# Patient Record
Sex: Male | Born: 1975 | Race: Black or African American | Hispanic: No | Marital: Single | Smoking: Former smoker
Health system: Southern US, Community
[De-identification: ages and names within clinical notes are randomized; demographics above are authoritative.]

## PROBLEM LIST (undated history)

## (undated) DIAGNOSIS — F419 Anxiety disorder, unspecified: Secondary | ICD-10-CM

## (undated) DIAGNOSIS — I10 Essential (primary) hypertension: Secondary | ICD-10-CM

## (undated) HISTORY — DX: Essential (primary) hypertension: I10

---
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
## 1998-01-10 ENCOUNTER — Emergency Department (HOSPITAL_COMMUNITY): Admission: EM | Admit: 1998-01-10 | Discharge: 1998-01-10 | Payer: Self-pay

## 1998-03-10 ENCOUNTER — Emergency Department (HOSPITAL_COMMUNITY): Admission: EM | Admit: 1998-03-10 | Discharge: 1998-03-10 | Payer: Self-pay

## 2001-06-03 ENCOUNTER — Emergency Department (HOSPITAL_COMMUNITY): Admission: EM | Admit: 2001-06-03 | Discharge: 2001-06-03 | Payer: Self-pay | Admitting: Emergency Medicine

## 2002-11-17 ENCOUNTER — Emergency Department (HOSPITAL_COMMUNITY): Admission: EM | Admit: 2002-11-17 | Discharge: 2002-11-17 | Payer: Self-pay | Admitting: Emergency Medicine

## 2004-11-05 ENCOUNTER — Emergency Department (HOSPITAL_COMMUNITY): Admission: EM | Admit: 2004-11-05 | Discharge: 2004-11-05 | Payer: Self-pay | Admitting: Emergency Medicine

## 2005-04-24 ENCOUNTER — Emergency Department (HOSPITAL_COMMUNITY): Admission: EM | Admit: 2005-04-24 | Discharge: 2005-04-24 | Payer: Self-pay | Admitting: Emergency Medicine

## 2013-02-02 ENCOUNTER — Emergency Department (HOSPITAL_COMMUNITY)
Admission: EM | Admit: 2013-02-02 | Discharge: 2013-02-02 | Disposition: A | Discharge status: Home / Self Care | Payer: BC Managed Care – PPO | Attending: Emergency Medicine | Admitting: Emergency Medicine

## 2013-02-02 ENCOUNTER — Encounter (HOSPITAL_COMMUNITY): Payer: Self-pay | Admitting: *Deleted

## 2013-02-02 ENCOUNTER — Emergency Department (HOSPITAL_COMMUNITY): Payer: BC Managed Care – PPO

## 2013-02-02 DIAGNOSIS — R079 Chest pain, unspecified: Secondary | ICD-10-CM | POA: Insufficient documentation

## 2013-02-02 DIAGNOSIS — M549 Dorsalgia, unspecified: Secondary | ICD-10-CM | POA: Insufficient documentation

## 2013-02-02 DIAGNOSIS — F17200 Nicotine dependence, unspecified, uncomplicated: Secondary | ICD-10-CM | POA: Insufficient documentation

## 2013-02-02 DIAGNOSIS — Z79899 Other long term (current) drug therapy: Secondary | ICD-10-CM | POA: Insufficient documentation

## 2013-02-02 MED ORDER — DIAZEPAM 5 MG PO TABS: 5.0000 mg | ORAL_TABLET | Freq: Two times a day (BID) | ORAL | Status: DC

## 2013-02-02 MED ORDER — IBUPROFEN 800 MG PO TABS
800.0000 mg | ORAL_TABLET | Freq: Once | ORAL | Status: AC
  Administered 2013-02-02: 800 mg via ORAL
  Filled 2013-02-02: qty 1

## 2013-02-02 MED ORDER — IBUPROFEN 800 MG PO TABS: 800.0000 mg | ORAL_TABLET | Freq: Three times a day (TID) | ORAL | Status: DC

## 2013-02-02 NOTE — ED Provider Notes (Signed)
CSN: [id number]     Arrival date & time 02/02/13  2009 History  This chart was scribed for Ruby Cola, PA-C, working with Gilda Crease, MD, by Frederik Pear, ED Scribe. This patient was seen in room WTR8/WTR8 and the patient's care was started at 2241.   First MD Initiated Contact with Patient 02/02/13 2241     Chief Complaint  Patient presents with  . Back Pain   (Consider location/radiation/quality/duration/timing/severity/associated sxs/prior Treatment) The history is provided by the patient and medical records. No language interpreter was used.   HPI Comments: Kevin Combs is a 37 y.o. male who presents to the Emergency Department complaining of intermittent mid-back pain radiates around to his lower rib cage that began last night without trauma or known injury. He denies a h/o of similar pain. He states the pain is exacerbated with deep inhalations and hip rotation and alleviated by sitting still. He denies fever, cough, SOB, hematuria, or fecal and urinary incontinence. He state he treated the pain with Flexeril this morning with no relief. He has no chronic medical conditions that require daily medications.  History reviewed. No pertinent past medical history. History reviewed. No pertinent past surgical history. No family history on file. History  Substance Use Topics  . Smoking status: Current Every Day Smoker  . Smokeless tobacco: Not on file  . Alcohol Use: No    Review of Systems  Cardiovascular: Positive for chest pain (lower chest wall pain).  Musculoskeletal: Positive for back pain.  All other systems reviewed and are negative.   Allergies  Review of patient's allergies indicates no known allergies.  Home Medications   Current Outpatient Rx  Name  Route  Sig  Dispense  Refill  . cyclobenzaprine (FLEXERIL) 10 MG tablet   Oral   Take 10 mg by mouth 2 (two) times daily as needed for muscle spasms.         . diazepam (VALIUM) 5 MG tablet  Oral   Take 1 tablet (5 mg total) by mouth 2 (two) times daily.   10 tablet   0   . ibuprofen (ADVIL,MOTRIN) 800 MG tablet   Oral   Take 1 tablet (800 mg total) by mouth 3 (three) times daily.   12 tablet   0    BP 146/90  Pulse 58  Temp(Src) 99 F (37.2 C) (Oral)  Resp 14  SpO2 100% Physical Exam  Nursing note and vitals reviewed. Constitutional: He is oriented to person, place, and time. He appears well-developed and well-nourished. No distress.  HENT:  Head: Normocephalic and atraumatic.  Eyes:  Normal appearance  Neck: Normal range of motion.  Cardiovascular: Normal rate, regular rhythm and normal heart sounds.  Exam reveals no gallop and no friction rub.   No murmur heard. Intact distal pulses.  Pulmonary/Chest: Effort normal and breath sounds normal. No respiratory distress. He has no wheezes. He has no rales. He exhibits no tenderness.  Musculoskeletal: Normal range of motion. He exhibits no tenderness.  Non-tender over the paraspinal cervical, lumbar, or spinal muscles. No midline tenderness.  Neurological: He is alert and oriented to person, place, and time. He has normal strength and normal reflexes. No sensory deficit.  Reflex Scores:      Patellar reflexes are 2+ on the right side and 2+ on the left side. Grip strength is 5/5 and equal in the bilateral lower extremities.   Skin: Skin is warm and dry. No rash noted.  Psychiatric: He has a normal mood  and affect. His behavior is normal.   ED Course  Procedures (including critical care time)  DIAGNOSTIC STUDIES: Oxygen Saturation is 100% on room air, normal by my interpretation.    COORDINATION OF CARE:  22:46- Discussed planned course of treatment in the ED with the patient, including a chest X-ray, who is agreeable at this time.  23:42- 2-view chest X-ray independently read by radiologist and independently reviewed by Ruby Cola, PA-C. Discussed the results and plan for discharge with the pt  including Valium and motrin, who is agreeable at this time.  Labs Review Labs Reviewed - No data to display Imaging Review Dg Chest 2 View  02/02/2013   CLINICAL DATA:  Mid back pain. Smoker.  EXAM: CHEST  2 VIEW  COMPARISON:  None.  FINDINGS: The heart size and mediastinal contours are within normal limits. Both lungs are clear. The visualized skeletal structures are unremarkable.  IMPRESSION: No active cardiopulmonary disease.   Electronically Signed   By: Amie Portland   On: 02/02/2013 23:24    MDM   1. Back pain    Healthy 37yo M presents w/ non-traumatic, bilateral mid-back and anterior lower rib pain since yesterday.  Pain occurs w/ movement and deep inspiration.  No associated sx.  On exam, afebrile, no respiratory distress, no bony or soft-tissue tenderness of back/chest, nml breath sounds, full ROM/nml strength/no NV deficits extremities.  CXR ordered d/t location of pain and is pending.  Suspect that he has contracted his children's viral respiratory illness and is experiencing myalgias.  If CXR neg, will d/c home w/ 800mg  ibuprofen and valium.  11:42 PM   I personally performed the services described in this documentation, which was scribed in my presence. The recorded information has been reviewed and is accurate.    Otilio Miu, PA-C 02/03/13 [phone]

## 2013-02-02 NOTE — ED Notes (Signed)
Pt c/o back pain from under shoulder blades to bottom of back x 1 day; no known injury; no cold symptoms; no fever

## 2013-02-03 NOTE — ED Provider Notes (Signed)
Medical screening examination/treatment/procedure(s) were performed by non-physician practitioner and as supervising physician I was immediately available for consultation/collaboration.    Christopher J. Pollina, MD 02/03/13 1709 

## 2015-01-28 ENCOUNTER — Encounter (HOSPITAL_COMMUNITY): Payer: Self-pay

## 2015-01-28 ENCOUNTER — Observation Stay (HOSPITAL_COMMUNITY)
Admission: EM | Admit: 2015-01-28 | Discharge: 2015-01-30 | Disposition: A | Discharge status: Home / Self Care | Payer: BLUE CROSS/BLUE SHIELD | Attending: General Surgery | Admitting: General Surgery

## 2015-01-28 ENCOUNTER — Observation Stay (HOSPITAL_COMMUNITY): Payer: BLUE CROSS/BLUE SHIELD | Admitting: Anesthesiology

## 2015-01-28 ENCOUNTER — Emergency Department (HOSPITAL_COMMUNITY): Payer: BLUE CROSS/BLUE SHIELD

## 2015-01-28 ENCOUNTER — Encounter (HOSPITAL_COMMUNITY)
Admission: EM | Disposition: A | Discharge status: Home / Self Care | Payer: Self-pay | Source: Home / Self Care | Attending: Emergency Medicine

## 2015-01-28 DIAGNOSIS — K3580 Unspecified acute appendicitis: Secondary | ICD-10-CM | POA: Diagnosis not present

## 2015-01-28 DIAGNOSIS — K353 Acute appendicitis with localized peritonitis, without perforation or gangrene: Secondary | ICD-10-CM | POA: Diagnosis present

## 2015-01-28 DIAGNOSIS — R109 Unspecified abdominal pain: Secondary | ICD-10-CM | POA: Diagnosis present

## 2015-01-28 DIAGNOSIS — F17290 Nicotine dependence, other tobacco product, uncomplicated: Secondary | ICD-10-CM | POA: Insufficient documentation

## 2015-01-28 HISTORY — PX: LAPAROSCOPIC APPENDECTOMY: SHX408

## 2015-01-28 LAB — CBC
HEMATOCRIT: 43.4 % (ref 39.0–52.0)
HEMOGLOBIN: 15.1 g/dL (ref 13.0–17.0)
MCH: 29.2 pg (ref 26.0–34.0)
MCHC: 34.8 g/dL (ref 30.0–36.0)
MCV: 83.8 fL (ref 78.0–100.0)
PLATELETS: 202 10*3/uL (ref 150–400)
RBC: 5.18 MIL/uL (ref 4.22–5.81)
RDW: 13.9 % (ref 11.5–15.5)
WBC: 17.4 10*3/uL — AB (ref 4.0–10.5)

## 2015-01-28 LAB — COMPREHENSIVE METABOLIC PANEL
ALT: 22 U/L (ref 17–63)
ANION GAP: 11 (ref 5–15)
AST: 25 U/L (ref 15–41)
Albumin: 4.8 g/dL (ref 3.5–5.0)
Alkaline Phosphatase: 48 U/L (ref 38–126)
BUN: 12 mg/dL (ref 6–20)
CHLORIDE: 100 mmol/L — AB (ref 101–111)
CO2: 27 mmol/L (ref 22–32)
CREATININE: 1.14 mg/dL (ref 0.61–1.24)
Calcium: 9.4 mg/dL (ref 8.9–10.3)
Glucose, Bld: 113 mg/dL — ABNORMAL HIGH (ref 65–99)
POTASSIUM: 3.1 mmol/L — AB (ref 3.5–5.1)
SODIUM: 138 mmol/L (ref 135–145)
Total Bilirubin: 3.4 mg/dL — ABNORMAL HIGH (ref 0.3–1.2)
Total Protein: 8.8 g/dL — ABNORMAL HIGH (ref 6.5–8.1)

## 2015-01-28 LAB — URINALYSIS, ROUTINE W REFLEX MICROSCOPIC
Glucose, UA: NEGATIVE mg/dL
Hgb urine dipstick: NEGATIVE
KETONES UR: 15 mg/dL — AB
LEUKOCYTES UA: NEGATIVE
NITRITE: NEGATIVE
PH: 6 (ref 5.0–8.0)
Protein, ur: NEGATIVE mg/dL
Specific Gravity, Urine: 1.03 (ref 1.005–1.030)
Urobilinogen, UA: 1 mg/dL (ref 0.0–1.0)

## 2015-01-28 LAB — LIPASE, BLOOD: LIPASE: 16 U/L — AB (ref 22–51)

## 2015-01-28 SURGERY — APPENDECTOMY, LAPAROSCOPIC
Anesthesia: General | Site: Abdomen

## 2015-01-28 MED ORDER — HYDROMORPHONE HCL 1 MG/ML IJ SOLN
1.0000 mg | INTRAMUSCULAR | Status: DC | PRN
  Administered 2015-01-28 – 2015-01-29 (×3): 1 mg via INTRAVENOUS
  Filled 2015-01-28 (×3): qty 1

## 2015-01-28 MED ORDER — PROPOFOL 10 MG/ML IV BOLUS
INTRAVENOUS | Status: DC | PRN
  Administered 2015-01-28: 200 mg via INTRAVENOUS

## 2015-01-28 MED ORDER — POTASSIUM CHLORIDE IN NACL 40-0.9 MEQ/L-% IV SOLN
INTRAVENOUS | Status: DC
  Filled 2015-01-28 (×2): qty 1000

## 2015-01-28 MED ORDER — LACTATED RINGERS IR SOLN
Status: DC | PRN
  Administered 2015-01-28: 1000 mL

## 2015-01-28 MED ORDER — ONDANSETRON HCL 4 MG/2ML IJ SOLN: 4.0000 mg | Freq: Four times a day (QID) | INTRAMUSCULAR | Status: DC | PRN

## 2015-01-28 MED ORDER — PIPERACILLIN-TAZOBACTAM 3.375 G IVPB 30 MIN
3.3750 g | Freq: Once | INTRAVENOUS | Status: AC
  Administered 2015-01-28: 3.375 g via INTRAVENOUS
  Filled 2015-01-28: qty 50

## 2015-01-28 MED ORDER — POTASSIUM CHLORIDE IN NACL 20-0.9 MEQ/L-% IV SOLN
INTRAVENOUS | Status: DC
  Administered 2015-01-28: 21:00:00 via INTRAVENOUS
  Filled 2015-01-28 (×3): qty 1000

## 2015-01-28 MED ORDER — SODIUM CHLORIDE 0.9 % IV BOLUS (SEPSIS)
1000.0000 mL | Freq: Once | INTRAVENOUS | Status: AC
  Administered 2015-01-28: 1000 mL via INTRAVENOUS

## 2015-01-28 MED ORDER — ONDANSETRON 4 MG PO TBDP
4.0000 mg | ORAL_TABLET | Freq: Four times a day (QID) | ORAL | Status: DC | PRN
Start: 2015-01-28 — End: 2015-01-30

## 2015-01-28 MED ORDER — BUPIVACAINE-EPINEPHRINE 0.5% -1:200000 IJ SOLN
INTRAMUSCULAR | Status: DC | PRN
  Administered 2015-01-28: 13 mL

## 2015-01-28 MED ORDER — CISATRACURIUM BESYLATE (PF) 10 MG/5ML IV SOLN
INTRAVENOUS | Status: DC | PRN
  Administered 2015-01-28: 2 mg via INTRAVENOUS
  Administered 2015-01-28: 1 mg via INTRAVENOUS
  Administered 2015-01-28: 5 mg via INTRAVENOUS

## 2015-01-28 MED ORDER — OXYCODONE HCL 5 MG/5ML PO SOLN
5.0000 mg | Freq: Once | ORAL | Status: DC | PRN
  Filled 2015-01-28: qty 5

## 2015-01-28 MED ORDER — PIPERACILLIN-TAZOBACTAM 3.375 G IVPB
3.3750 g | Freq: Three times a day (TID) | INTRAVENOUS | Status: DC
  Administered 2015-01-28 – 2015-01-30 (×5): 3.375 g via INTRAVENOUS
  Filled 2015-01-28 (×7): qty 50

## 2015-01-28 MED ORDER — PIPERACILLIN-TAZOBACTAM 3.375 G IVPB
3.3750 g | Freq: Three times a day (TID) | INTRAVENOUS | Status: DC
  Filled 2015-01-28: qty 50

## 2015-01-28 MED ORDER — FENTANYL CITRATE (PF) 100 MCG/2ML IJ SOLN
INTRAMUSCULAR | Status: DC | PRN
  Administered 2015-01-28 (×2): 50 ug via INTRAVENOUS
  Administered 2015-01-28: 150 ug via INTRAVENOUS

## 2015-01-28 MED ORDER — DIPHENHYDRAMINE HCL 12.5 MG/5ML PO ELIX: 12.5000 mg | ORAL_SOLUTION | Freq: Four times a day (QID) | ORAL | Status: DC | PRN

## 2015-01-28 MED ORDER — IOHEXOL 300 MG/ML  SOLN
25.0000 mL | Freq: Once | INTRAMUSCULAR | Status: AC | PRN
  Administered 2015-01-28: 50 mL via ORAL

## 2015-01-28 MED ORDER — BUPIVACAINE-EPINEPHRINE (PF) 0.5% -1:200000 IJ SOLN
INTRAMUSCULAR | Status: AC
  Filled 2015-01-28: qty 30

## 2015-01-28 MED ORDER — MORPHINE SULFATE (PF) 4 MG/ML IV SOLN
4.0000 mg | Freq: Once | INTRAVENOUS | Status: AC
Start: 2015-01-28 — End: 2015-01-28
  Administered 2015-01-28: 4 mg via INTRAVENOUS
  Filled 2015-01-28: qty 1

## 2015-01-28 MED ORDER — HYDROMORPHONE HCL 1 MG/ML IJ SOLN
INTRAMUSCULAR | Status: DC | PRN
  Administered 2015-01-28: 0.5 mg via INTRAVENOUS
  Administered 2015-01-28 (×2): .4 mg via INTRAVENOUS
  Administered 2015-01-28: .2 mg via INTRAVENOUS
  Administered 2015-01-28: 0.5 mg via INTRAVENOUS

## 2015-01-28 MED ORDER — ONDANSETRON 4 MG PO TBDP: 4.0000 mg | ORAL_TABLET | Freq: Four times a day (QID) | ORAL | Status: DC | PRN

## 2015-01-28 MED ORDER — LIDOCAINE HCL (CARDIAC) 20 MG/ML IV SOLN
INTRAVENOUS | Status: AC
  Filled 2015-01-28: qty 5

## 2015-01-28 MED ORDER — HYDROMORPHONE HCL 2 MG/ML IJ SOLN
INTRAMUSCULAR | Status: AC
  Filled 2015-01-28: qty 1

## 2015-01-28 MED ORDER — ONDANSETRON HCL 4 MG/2ML IJ SOLN
4.0000 mg | Freq: Once | INTRAMUSCULAR | Status: AC
Start: 2015-01-28 — End: 2015-01-28
  Administered 2015-01-28: 4 mg via INTRAVENOUS
  Filled 2015-01-28: qty 2

## 2015-01-28 MED ORDER — ONDANSETRON HCL 4 MG/2ML IJ SOLN
4.0000 mg | Freq: Four times a day (QID) | INTRAMUSCULAR | Status: DC | PRN
  Administered 2015-01-28 (×2): 4 mg via INTRAVENOUS
  Filled 2015-01-28: qty 2

## 2015-01-28 MED ORDER — 0.9 % SODIUM CHLORIDE (POUR BTL) OPTIME
TOPICAL | Status: DC | PRN
  Administered 2015-01-28: 1000 mL

## 2015-01-28 MED ORDER — DEXAMETHASONE SODIUM PHOSPHATE 10 MG/ML IJ SOLN
INTRAMUSCULAR | Status: DC | PRN
  Administered 2015-01-28: 10 mg via INTRAVENOUS

## 2015-01-28 MED ORDER — HYDROMORPHONE HCL 1 MG/ML IJ SOLN: 0.2500 mg | INTRAMUSCULAR | Status: DC | PRN

## 2015-01-28 MED ORDER — OXYCODONE HCL 5 MG PO TABS: 5.0000 mg | ORAL_TABLET | Freq: Once | ORAL | Status: DC | PRN

## 2015-01-28 MED ORDER — LACTATED RINGERS IV SOLN
INTRAVENOUS | Status: DC | PRN
  Administered 2015-01-28 (×2): via INTRAVENOUS

## 2015-01-28 MED ORDER — MORPHINE SULFATE (PF) 2 MG/ML IV SOLN
1.0000 mg | INTRAVENOUS | Status: DC | PRN
  Administered 2015-01-28: 2 mg via INTRAVENOUS
  Filled 2015-01-28: qty 1

## 2015-01-28 MED ORDER — SUGAMMADEX SODIUM 200 MG/2ML IV SOLN
INTRAVENOUS | Status: AC
  Filled 2015-01-28: qty 2

## 2015-01-28 MED ORDER — POLYETHYLENE GLYCOL 3350 17 G PO PACK: 17.0000 g | PACK | Freq: Every day | ORAL | Status: DC | PRN

## 2015-01-28 MED ORDER — PROPOFOL 10 MG/ML IV BOLUS
INTRAVENOUS | Status: AC
  Filled 2015-01-28: qty 20

## 2015-01-28 MED ORDER — ENOXAPARIN SODIUM 40 MG/0.4ML ~~LOC~~ SOLN
40.0000 mg | SUBCUTANEOUS | Status: DC
  Filled 2015-01-28 (×2): qty 0.4

## 2015-01-28 MED ORDER — SUCCINYLCHOLINE CHLORIDE 20 MG/ML IJ SOLN
INTRAMUSCULAR | Status: DC | PRN
  Administered 2015-01-28: 100 mg via INTRAVENOUS

## 2015-01-28 MED ORDER — DIPHENHYDRAMINE HCL 50 MG/ML IJ SOLN
12.5000 mg | Freq: Four times a day (QID) | INTRAMUSCULAR | Status: DC | PRN
  Administered 2015-01-30: 12.5 mg via INTRAVENOUS
  Filled 2015-01-28: qty 1

## 2015-01-28 MED ORDER — DEXAMETHASONE SODIUM PHOSPHATE 10 MG/ML IJ SOLN
INTRAMUSCULAR | Status: AC
  Filled 2015-01-28: qty 2

## 2015-01-28 MED ORDER — SUGAMMADEX SODIUM 200 MG/2ML IV SOLN
INTRAVENOUS | Status: DC | PRN
  Administered 2015-01-28: 181.4 mg via INTRAVENOUS

## 2015-01-28 MED ORDER — OXYCODONE-ACETAMINOPHEN 5-325 MG PO TABS
1.0000 | ORAL_TABLET | ORAL | Status: DC | PRN
  Administered 2015-01-29 – 2015-01-30 (×3): 2 via ORAL
  Filled 2015-01-28 (×3): qty 2

## 2015-01-28 MED ORDER — IOHEXOL 300 MG/ML  SOLN
100.0000 mL | Freq: Once | INTRAMUSCULAR | Status: AC | PRN
Start: 2015-01-28 — End: 2015-01-28
  Administered 2015-01-28: 100 mL via INTRAVENOUS

## 2015-01-28 MED ORDER — FENTANYL CITRATE (PF) 250 MCG/5ML IJ SOLN
INTRAMUSCULAR | Status: AC
  Filled 2015-01-28: qty 25

## 2015-01-28 MED ORDER — LIDOCAINE HCL (CARDIAC) 20 MG/ML IV SOLN
INTRAVENOUS | Status: DC | PRN
  Administered 2015-01-28: 100 mg via INTRAVENOUS

## 2015-01-28 MED ORDER — ONDANSETRON HCL 4 MG/2ML IJ SOLN
INTRAMUSCULAR | Status: AC
  Filled 2015-01-28: qty 2

## 2015-01-28 MED ORDER — PANTOPRAZOLE SODIUM 40 MG IV SOLR
40.0000 mg | Freq: Every day | INTRAVENOUS | Status: DC
  Administered 2015-01-28 – 2015-01-29 (×2): 40 mg via INTRAVENOUS
  Filled 2015-01-28 (×3): qty 40

## 2015-01-28 SURGICAL SUPPLY — 56 items
ADH SKN CLS APL DERMABOND .7 (GAUZE/BANDAGES/DRESSINGS) ×1
APPLIER CLIP ROT 10 11.4 M/L (STAPLE)
APR CLP MED LRG 11.4X10 (STAPLE)
BAG SPEC RTRVL LRG 6X4 10 (ENDOMECHANICALS) ×1
BLADE CLIPPER SENSICLIP SURGIC (BLADE) ×2 IMPLANT
CABLE HIGH FREQUENCY MONO STRZ (ELECTRODE) ×2 IMPLANT
CHLORAPREP W/TINT 26ML (MISCELLANEOUS) ×3 IMPLANT
CLIP APPLIE ROT 10 11.4 M/L (STAPLE) IMPLANT
CLOSURE WOUND 1/2 X4 (GAUZE/BANDAGES/DRESSINGS)
COVER SURGICAL LIGHT HANDLE (MISCELLANEOUS) ×3 IMPLANT
CUTTER FLEX LINEAR 45M (STAPLE) ×2 IMPLANT
DECANTER SPIKE VIAL GLASS SM (MISCELLANEOUS) ×1 IMPLANT
DERMABOND ADVANCED (GAUZE/BANDAGES/DRESSINGS) ×2
DERMABOND ADVANCED .7 DNX12 (GAUZE/BANDAGES/DRESSINGS) IMPLANT
DEVICE TROCAR PUNCTURE CLOSURE (ENDOMECHANICALS) IMPLANT
DRAPE LAPAROSCOPIC ABDOMINAL (DRAPES) ×3 IMPLANT
ELECT REM PT RETURN 9FT ADLT (ELECTROSURGICAL) ×3
ELECTRODE REM PT RTRN 9FT ADLT (ELECTROSURGICAL) ×1 IMPLANT
ENDOLOOP SUT PDS II  0 18 (SUTURE)
ENDOLOOP SUT PDS II 0 18 (SUTURE) IMPLANT
GAUZE SPONGE 2X2 8PLY STRL LF (GAUZE/BANDAGES/DRESSINGS) ×1 IMPLANT
GLOVE BIOGEL PI IND STRL 6.5 (GLOVE) IMPLANT
GLOVE BIOGEL PI IND STRL 8.5 (GLOVE) IMPLANT
GLOVE BIOGEL PI INDICATOR 6.5 (GLOVE) ×2
GLOVE BIOGEL PI INDICATOR 8.5 (GLOVE) ×2
GLOVE EUDERMIC 7 POWDERFREE (GLOVE) ×3 IMPLANT
GLOVE SURG SS PI 6.5 STRL IVOR (GLOVE) ×2 IMPLANT
GLOVE SURG SS PI 8.5 STRL IVOR (GLOVE) ×2
GLOVE SURG SS PI 8.5 STRL STRW (GLOVE) IMPLANT
GOWN STRL REUS W/ TWL LRG LVL4 (GOWN DISPOSABLE) IMPLANT
GOWN STRL REUS W/TWL LRG LVL4 (GOWN DISPOSABLE) ×3
GOWN STRL REUS W/TWL XL LVL3 (GOWN DISPOSABLE) ×6 IMPLANT
HEMOSTAT SNOW SURGICEL 2X4 (HEMOSTASIS) ×2 IMPLANT
KIT BASIN OR (CUSTOM PROCEDURE TRAY) ×3 IMPLANT
LIQUID BAND (GAUZE/BANDAGES/DRESSINGS) ×1 IMPLANT
POUCH SPECIMEN RETRIEVAL 10MM (ENDOMECHANICALS) ×3 IMPLANT
RELOAD 45 VASCULAR/THIN (ENDOMECHANICALS) IMPLANT
RELOAD STAPLE 45 2.5 WHT GRN (ENDOMECHANICALS) IMPLANT
RELOAD STAPLE 45 3.5 BLU ETS (ENDOMECHANICALS) IMPLANT
RELOAD STAPLE TA45 3.5 REG BLU (ENDOMECHANICALS) ×3 IMPLANT
SET IRRIG TUBING LAPAROSCOPIC (IRRIGATION / IRRIGATOR) ×3 IMPLANT
SHEARS HARMONIC ACE PLUS 36CM (ENDOMECHANICALS) ×2 IMPLANT
SOLUTION ANTI FOG 6CC (MISCELLANEOUS) ×3 IMPLANT
SPONGE GAUZE 2X2 STER 10/PKG (GAUZE/BANDAGES/DRESSINGS) ×2
STRIP CLOSURE SKIN 1/2X4 (GAUZE/BANDAGES/DRESSINGS) IMPLANT
SUT MNCRL AB 4-0 PS2 18 (SUTURE) ×3 IMPLANT
SUT VICRYL 0 UR6 27IN ABS (SUTURE) IMPLANT
TOWEL OR 17X26 10 PK STRL BLUE (TOWEL DISPOSABLE) ×3 IMPLANT
TOWEL OR NON WOVEN STRL DISP B (DISPOSABLE) ×3 IMPLANT
TRAY FOLEY W/METER SILVER 14FR (SET/KITS/TRAYS/PACK) ×1 IMPLANT
TRAY FOLEY W/METER SILVER 16FR (SET/KITS/TRAYS/PACK) ×3 IMPLANT
TRAY LAPAROSCOPIC (CUSTOM PROCEDURE TRAY) ×3 IMPLANT
TROCAR BLADELESS OPT 5 100 (ENDOMECHANICALS) ×3 IMPLANT
TROCAR XCEL 12X100 BLDLESS (ENDOMECHANICALS) ×3 IMPLANT
TROCAR XCEL BLUNT TIP 100MML (ENDOMECHANICALS) ×3 IMPLANT
TUBING INSUFFLATION 10FT LAP (TUBING) ×3 IMPLANT

## 2015-01-28 NOTE — Anesthesia Procedure Notes (Signed)
Procedure Name: Intubation Date/Time: 01/28/2015 4:02 PM Performed by: Leroy Libman L Patient Re-evaluated:Patient Re-evaluated prior to inductionOxygen Delivery Method: Circle system utilized Preoxygenation: Pre-oxygenation with 100% oxygen Intubation Type: IV induction, Rapid sequence and Cricoid Pressure applied Laryngoscope Size: Miller and 3 Grade View: Grade I Tube type: Oral Tube size: 8.0 mm Number of attempts: 1 Airway Equipment and Method: Stylet Placement Confirmation: ETT inserted through vocal cords under direct vision,  breath sounds checked- equal and bilateral and positive ETCO2 Secured at: 23 cm Tube secured with: Tape Dental Injury: Teeth and Oropharynx as per pre-operative assessment

## 2015-01-28 NOTE — ED Notes (Signed)
Patient aware that a urine sample is needed, urinal is at the bedside. 

## 2015-01-28 NOTE — Anesthesia Preprocedure Evaluation (Signed)
Anesthesia Evaluation  Patient identified by MRN, date of birth, ID band Patient awake    Reviewed: Allergy & Precautions, NPO status , Patient's Chart, lab work & pertinent test results  Airway Mallampati: II   Neck ROM: full    Dental   Pulmonary Current Smoker,    breath sounds clear to auscultation       Cardiovascular negative cardio ROS   Rhythm:regular Rate:Normal     Neuro/Psych    GI/Hepatic   Endo/Other    Renal/GU      Musculoskeletal   Abdominal   Peds  Hematology   Anesthesia Other Findings   Reproductive/Obstetrics                             Anesthesia Physical Anesthesia Plan  ASA: II and emergent  Anesthesia Plan: General   Post-op Pain Management:    Induction: Intravenous, Rapid sequence and Cricoid pressure planned  Airway Management Planned: Oral ETT  Additional Equipment:   Intra-op Plan:   Post-operative Plan: Extubation in OR  Informed Consent: I have reviewed the patients History and Physical, chart, labs and discussed the procedure including the risks, benefits and alternatives for the proposed anesthesia with the patient or authorized representative who has indicated his/her understanding and acceptance.     Plan Discussed with: CRNA, Anesthesiologist and Surgeon  Anesthesia Plan Comments:         Anesthesia Quick Evaluation

## 2015-01-28 NOTE — Transfer of Care (Signed)
Immediate Anesthesia Transfer of Care Note  Patient: Kevin Combs  Procedure(s) Performed: Procedure(s): APPENDECTOMY LAPAROSCOPIC (N/A)  Patient Location: PACU  Anesthesia Type:General  Level of Consciousness:  sedated, patient cooperative and responds to stimulation  Airway & Oxygen Therapy:Patient Spontanous Breathing and Patient connected to face mask oxgen  Post-op Assessment:  Report given to PACU RN and Post -op Vital signs reviewed and stable  Post vital signs:  Reviewed and stable  Last Vitals:  Filed Vitals:   01/28/15 1147  BP: 136/80  Pulse: 65  Temp: 37.3 C  Resp: 16    Complications: No apparent anesthesia complications

## 2015-01-28 NOTE — ED Notes (Signed)
Surgery at bedside.

## 2015-01-28 NOTE — Anesthesia Postprocedure Evaluation (Signed)
Anesthesia Post Note  Patient: Kevin Combs Cousin  Procedure(s) Performed: Procedure(s) (LRB): APPENDECTOMY LAPAROSCOPIC (N/A)  Anesthesia type: General  Patient location: PACU  Post pain: Pain level controlled and Adequate analgesia  Post assessment: Post-op Vital signs reviewed, Patient's Cardiovascular Status Stable, Respiratory Function Stable, Patent Airway and Pain level controlled  Last Vitals:  Filed Vitals:   01/28/15 1830  BP: 137/77  Pulse: 75  Temp: 37.3 C  Resp: 18    Post vital signs: Reviewed and stable  Level of consciousness: awake, alert  and oriented  Complications: No apparent anesthesia complications

## 2015-01-28 NOTE — H&P (Signed)
Kevin Combs is an 39 y.o. male.   Chief Complaint: abdominal pain HPI: 39 y/o male presents to the ED with abdominal pain, distension, nausea and  Recurrent vomiting. Symptoms started yesterday AM, he thought it was food poisoning initially. Then he thought he was constipated, but had a BM last evening, no fever and has not been able to eat.  Ongoing chills and sweats.  Pain was mid abdomen, more generalized but has now gone to RLQ.  That is the only place he is tender. Work up in the ED shows he has a low grade fever 99.1, BP up some. K+ is 3.1, lipase 16, AST/ALT are normal.  Bilirubin is up to 3.4.  WBC 17.4, UA is normal.  CT scan shows:  There is an 11 mm stone in the appendix. The appendix also contains debris. The appendix is dilated to about 2 cm in diameter. There is some free fluid around the appendix as well as moderate inflammatory change. No loculated fluid collection or abscess is identified. There is no free air. We are ask to see.  History reviewed. No pertinent past medical history.  History reviewed. No pertinent past surgical history.  History reviewed. No pertinent family history. Social History:  reports that he has been smoking Cigars.  He does not have any smokeless tobacco history on file. He reports that he drinks alcohol. He reports that he uses illicit drugs (Marijuana).  Tobacco:  Cigars couple per day,  DRUGS:  Marijuana daily ETOH: weekends "a few drinks."  Allergies: No Known Allergies  Prior to Admission medications   Not on File     Results for orders placed or performed during the hospital encounter of 01/28/15 (from the past 48 hour(s))  Lipase, blood     Status: Abnormal   Collection Time: 01/28/15 10:22 AM  Result Value Ref Range   Lipase 16 (L) 22 - 51 U/L  Comprehensive metabolic panel     Status: Abnormal   Collection Time: 01/28/15 10:22 AM  Result Value Ref Range   Sodium 138 135 - 145 mmol/L   Potassium 3.1 (L) 3.5 - 5.1 mmol/L   Chloride  100 (L) 101 - 111 mmol/L   CO2 27 22 - 32 mmol/L   Glucose, Bld 113 (H) 65 - 99 mg/dL   BUN 12 6 - 20 mg/dL   Creatinine, Ser 1.14 0.61 - 1.24 mg/dL   Calcium 9.4 8.9 - 10.3 mg/dL   Total Protein 8.8 (H) 6.5 - 8.1 g/dL   Albumin 4.8 3.5 - 5.0 g/dL   AST 25 15 - 41 U/L   ALT 22 17 - 63 U/L   Alkaline Phosphatase 48 38 - 126 U/L   Total Bilirubin 3.4 (H) 0.3 - 1.2 mg/dL   GFR calc non Af Amer >60 >60 mL/min   GFR calc Af Amer >60 >60 mL/min    Comment: (NOTE) The eGFR has been calculated using the CKD EPI equation. This calculation has not been validated in all clinical situations. eGFR's persistently <60 mL/min signify possible Chronic Kidney Disease.    Anion gap 11 5 - 15  CBC     Status: Abnormal   Collection Time: 01/28/15 10:22 AM  Result Value Ref Range   WBC 17.4 (H) 4.0 - 10.5 K/uL   RBC 5.18 4.22 - 5.81 MIL/uL   Hemoglobin 15.1 13.0 - 17.0 g/dL   HCT 43.4 39.0 - 52.0 %   MCV 83.8 78.0 - 100.0 fL   MCH 29.2  26.0 - 34.0 pg   MCHC 34.8 30.0 - 36.0 g/dL   RDW 13.9 11.5 - 15.5 %   Platelets 202 150 - 400 K/uL  Urinalysis, Routine w reflex microscopic (not at [facility])     Status: Abnormal   Collection Time: 01/28/15 12:06 PM  Result Value Ref Range   Color, Urine AMBER (A) YELLOW    Comment: BIOCHEMICALS MAY BE AFFECTED BY COLOR   APPearance CLEAR CLEAR   Specific Gravity, Urine 1.030 1.005 - 1.030   pH 6.0 5.0 - 8.0   Glucose, UA NEGATIVE NEGATIVE mg/dL   Hgb urine dipstick NEGATIVE NEGATIVE   Bilirubin Urine SMALL (A) NEGATIVE   Ketones, ur 15 (A) NEGATIVE mg/dL   Protein, ur NEGATIVE NEGATIVE mg/dL   Urobilinogen, UA 1.0 0.0 - 1.0 mg/dL   Nitrite NEGATIVE NEGATIVE   Leukocytes, UA NEGATIVE NEGATIVE    Comment: MICROSCOPIC NOT DONE ON URINES WITH NEGATIVE PROTEIN, BLOOD, LEUKOCYTES, NITRITE, OR GLUCOSE <1000 mg/dL.   Ct Abdomen Pelvis W Contrast  01/28/2015   CLINICAL DATA:  Pt states has had severe abdominal pain. Mid/lower. Vomiting. Not able to eat. Abdominal  distension. RLQ pain since yesterday  EXAM: CT ABDOMEN AND PELVIS WITH CONTRAST  TECHNIQUE: Multidetector CT imaging of the abdomen and pelvis was performed using the standard protocol following bolus administration of intravenous contrast.  CONTRAST:  152m OMNIPAQUE IOHEXOL 300 MG/ML SOLN, 545mOMNIPAQUE IOHEXOL 300 MG/ML SOLN  COMPARISON:  None.  FINDINGS: Lower chest:  Minimal left base atelectasis  Hepatobiliary: Negative  Pancreas: Negative  Spleen: Negative  Adrenals/Urinary Tract: 1 mm nonobstructing left lower pole renal stone. Otherwise negative.  Stomach/Bowel: There is an 11 mm stone in the appendix. The appendix also contains debris. The appendix is dilated to about 2 cm in diameter. There is some free fluid around the appendix as well as moderate inflammatory change. No loculated fluid collection or abscess is identified. There is no free air.  Vascular/Lymphatic: Negative  Reproductive: Negative  Other: None  Musculoskeletal: No acute findings  IMPRESSION: Acute appendicitis as described above   Electronically Signed   By: RaSkipper Cliche.D.   On: 01/28/2015 12:20    Review of Systems  Constitutional: Positive for fever (not measured) and chills.  HENT: Negative.   Eyes: Negative.   Respiratory: Negative.   Cardiovascular: Negative.   Gastrointestinal: Positive for nausea, vomiting, abdominal pain and constipation (he thought he had constipation, but normally he does not have constipation.).  Genitourinary: Negative.   Musculoskeletal: Negative.   Skin: Negative.   Neurological: Negative.   Endo/Heme/Allergies: Negative.   Psychiatric/Behavioral: Negative.     Blood pressure 136/80, pulse 65, temperature 99.1 F (37.3 C), temperature source Oral, resp. rate 16, SpO2 100 %. Physical Exam  Constitutional: He is oriented to person, place, and time. He appears well-developed and well-nourished.  HENT:  Head: Normocephalic.  Nose: Nose normal.  Eyes: Conjunctivae and EOM are  normal. Right eye exhibits no discharge. Left eye exhibits no discharge. No scleral icterus.  Neck: Neck supple. No JVD present. No tracheal deviation present. No thyromegaly present.  Cardiovascular: Normal rate, regular rhythm, normal heart sounds and intact distal pulses.  Exam reveals no gallop.   No murmur heard. Respiratory: Effort normal and breath sounds normal. No respiratory distress. He has no wheezes. He has no rales. He exhibits no tenderness.  GI: Soft. Bowel sounds are normal. He exhibits no distension and no mass. There is tenderness (RLQ). There is no rebound and  no guarding.  Musculoskeletal: He exhibits no edema.  Lymphadenopathy:    He has no cervical adenopathy.  Neurological: He is alert and oriented to person, place, and time. No cranial nerve deficit.  Skin: Skin is warm and dry. No rash noted. He is not diaphoretic. No erythema. No pallor.  Psychiatric: He has a normal mood and affect. His behavior is normal. Judgment and thought content normal.     Assessment/Plan Acute appendicitis Tobacco use Daily Marijuana use  Plan:  Zosyn, IV fluids, NPO, surgery later this afternoon. Risk and benefits discussed with patient and family in the room.  Emeka Lindner 01/28/2015, 12:44 PM

## 2015-01-28 NOTE — ED Provider Notes (Signed)
CSN: [id number]     Arrival date & time 01/28/15  0919 History   First MD Initiated Contact with Patient 01/28/15 [phone]     Chief Complaint  Patient presents with  . Abdominal Pain     (Consider location/radiation/quality/duration/timing/severity/associated sxs/prior Treatment) HPI Comments: Patient with severe right-sided lower abdominal pain since last night. States he has vomited countless times. Unable to eat. Denies diarrhea. Normal bowel movement yesterday. Pain is constant, worse with movement and palpation. No fever. No urinary symptoms. No testicular pain. No previous abdominal surgeries. No chronic medical problems does not take medications.  The history is provided by the patient.    History reviewed. No pertinent past medical history. History reviewed. No pertinent past surgical history. History reviewed. No pertinent family history. Social History  Substance Use Topics  . Smoking status: Current Every Day Smoker    Types: Cigars  . Smokeless tobacco: None  . Alcohol Use: Yes     Comment: social    Review of Systems  Constitutional: Positive for activity change, appetite change and fatigue. Negative for fever.  HENT: Negative for congestion and rhinorrhea.   Respiratory: Negative for cough, chest tightness and shortness of breath.   Cardiovascular: Negative for chest pain.  Gastrointestinal: Positive for nausea, vomiting and abdominal pain. Negative for diarrhea and constipation.  Genitourinary: Negative for dysuria, urgency and hematuria.  Musculoskeletal: Negative for arthralgias.  Skin: Negative for rash.  Neurological: Negative for dizziness, weakness and headaches.  A complete 10 system review of systems was obtained and all systems are negative except as noted in the HPI and PMH.      Allergies  Review of patient's allergies indicates no known allergies.  Home Medications   Prior to Admission medications   Not on File   BP 136/80 mmHg  Pulse 65   Temp(Src) 99.1 F (37.3 C) (Oral)  Resp 16  Ht 5\' 11"  (1.803 m)  Wt 200 lb (90.719 kg)  BMI 27.91 kg/m2  SpO2 100% Physical Exam  Constitutional: He is oriented to person, place, and time. He appears well-developed and well-nourished. He appears distressed.  uncomfortable  HENT:  Head: Normocephalic and atraumatic.  Mouth/Throat: Oropharynx is clear and moist. No oropharyngeal exudate.  Eyes: Conjunctivae and EOM are normal. Pupils are equal, round, and reactive to light.  Neck: Normal range of motion. Neck supple.  No meningismus.  Cardiovascular: Normal rate, regular rhythm, normal heart sounds and intact distal pulses.   No murmur heard. Pulmonary/Chest: Effort normal and breath sounds normal. No respiratory distress.  Abdominal: Soft. There is tenderness. There is guarding. There is no rebound.  Severe tenderness to right lower quadrant with guarding  Genitourinary:  No testicular tenderness  Musculoskeletal: Normal range of motion. He exhibits no edema or tenderness.  No CVAT  Neurological: He is alert and oriented to person, place, and time. No cranial nerve deficit. He exhibits normal muscle tone. Coordination normal.  No ataxia on finger to nose bilaterally. No pronator drift. 5/5 strength throughout. CN 2-12 intact. Negative Romberg. Equal grip strength. Sensation intact. Gait is normal.   Skin: Skin is warm.  Psychiatric: He has a normal mood and affect. His behavior is normal.  Nursing note and vitals reviewed.   ED Course  Procedures (including critical care time) Labs Review Labs Reviewed  LIPASE, BLOOD - Abnormal; Notable for the following:    Lipase 16 (*)    All other components within normal limits  COMPREHENSIVE METABOLIC PANEL - Abnormal; Notable for the  following:    Potassium 3.1 (*)    Chloride 100 (*)    Glucose, Bld 113 (*)    Total Protein 8.8 (*)    Total Bilirubin 3.4 (*)    All other components within normal limits  CBC - Abnormal; Notable for  the following:    WBC 17.4 (*)    All other components within normal limits  URINALYSIS, ROUTINE W REFLEX MICROSCOPIC (NOT AT [facility]) - Abnormal; Notable for the following:    Color, Urine AMBER (*)    Bilirubin Urine SMALL (*)    Ketones, ur 15 (*)    All other components within normal limits  SURGICAL PATHOLOGY    Imaging Review Ct Abdomen Pelvis W Contrast  01/28/2015   CLINICAL DATA:  Pt states has had severe abdominal pain. Mid/lower. Vomiting. Not able to eat. Abdominal distension. RLQ pain since yesterday  EXAM: CT ABDOMEN AND PELVIS WITH CONTRAST  TECHNIQUE: Multidetector CT imaging of the abdomen and pelvis was performed using the standard protocol following bolus administration of intravenous contrast.  CONTRAST:  OMNIPAQUE IOHEXOL 300 MG/ML SOLN, 50mL OMNIPAQUE IOHEXOL 300 MG/ML SOLN  COMPARISON:  None.  FINDINGS: Lower chest:  Minimal left base atelectasis  Hepatobiliary: Negative  Pancreas: Negative  Spleen: Negative  Adrenals/Urinary Tract: 1 mm nonobstructing left lower pole renal stone. Otherwise negative.  Stomach/Bowel: There is an 11 mm stone in the appendix. The appendix also contains debris. The appendix is dilated to about 2 cm in diameter. There is some free fluid around the appendix as well as moderate inflammatory change. No loculated fluid collection or abscess is identified. There is no free air.  Vascular/Lymphatic: Negative  Reproductive: Negative  Other: None  Musculoskeletal: No acute findings  IMPRESSION: Acute appendicitis as described above   Electronically Signed   By: Esperanza Heir M.D.   On: 01/28/2015 12:20   I have personally reviewed and evaluated these images and lab results as part of my medical decision-making.   EKG Interpretation None      MDM   Final diagnoses:  Acute appendicitis with localized peritonitis   right lower quadrant pain with nausea and vomiting. Concern for appendicitis. Vital stable.  Labs, IV fluids, symptom control,  discuss with surgery. NPO  CT confirms acute appendicitis with stone and stranding and free fluid. IV zosyn given.  D/w surgery who will admit.     Glynn Octave, MD 01/28/15 [phone]

## 2015-01-28 NOTE — ED Notes (Signed)
Pt states has had severe abdominal pain.  Mid/lower.  Vomiting.  Not able to eat.  Abdominal distension.  Thought he was constipated but is having bm's normal  Last bm yesterday.  No fever.  Not eating

## 2015-01-28 NOTE — Op Note (Signed)
Patient Name:           Kevin Combs   Date of Surgery:        01/28/2015  Pre op Diagnosis:      Acute appendicitis  Post op Diagnosis:    Acute suppurative appendicitis  Procedure:                 Laparoscopic appendectomy  Surgeon:                     Angelia Mould. Derrell Lolling, M.D., FACS  Assistant:                      Or staff  Operative Indications:   This is a 38 year old African-American gentleman who presents with a 24-hour history of abdominal pain nausea and vomiting.  No real diarrhea.  Pain is localized to the right lower quadrant.  Examination reveals localized tenderness and guarding and percussion tenderness and rebound in the right lower quadrant.  WBC is 17,400.  Total bilirubin 3.4 but liver function test are normal.  Potassium 3.1.  CT scan shows an 11 mm stone in the appendix and debris in the appendix.  Next was dilated to 2 cm piece some free fluid around the appendix and moderate inflammatory change but no abscess.  No free air.  He is brought to the operating room urgently for appendectomy  Operative Findings:       The appendix was acutely inflamed.  It was quite distended.  Lots of exudate.  No evidence of perforation or abscess.  We can separate the appendix and terminal ileum and cecum.  We could ultimately clearly identify the junction of the appendix with the cecum.  The appendix went from the cecum and inferiorly and went far lateral with the tip of the appendix was in a retrocecal location.  We ultimately saw all of the appendix and felt that we removed the entirety of the appendix.  Procedure in Detail:          Following the induction of general endotracheal anesthesia a Foley catheter was placed.  The abdomen was prepped and draped in a sterile fashion.  He had been started on broad-spectrum antibiotics.  Surgical timeout was performed.  0.5% Marcaine with epinephrine was used as local infiltration anesthesia.     An 11 mm Hassan trocar was placed in the midline at  the superior umbilical rim under open technique.  Entry was uneventful.  The [facility] trocar were secured the pursestring suture of 0 Vicryl.  Pneumoperitoneum was created and video camera was inserted.  A 12 mm trochars placed in the suprapubic area and a 5 mm trocar placed in the left lower quadrant.  There were able to identify the terminal ileum and the cecum and we slowly dissected and removed all of the exudate to separate the anatomy of the structures.  We could see the appendix base at its junction of the cecum.  We saw the appendix body and tip going to the right lateral and down almost behind the cecum.  We mobilized this up somewhat.  We ultimately decided to create a window in the appendiceal mesentery and transected the appendix with an Endo GIA stapling device at the level of the cecum.  I was then  able to lift the appendix up and in several small steps divide the appendiceal mesentery with the Harmonic scalpel.  This allowed [place] to dissect the appendix up out of the  lateral retrocecal area and to ultimately remove the entire appendix, placed in a specimen bag and removed.  We had some diffuse oozing from the bed of the appendix which after irrigation and placing some snow sponge completely stopped and was hemostatic.  We evacuated all of the blood.  We observe this for about 10 minutes and there was no more bleeding.  Hemostatic sponge was removed from the abdominal cavity.  We checked the liver, gallbladder, right colon, terminal ileum, sigmoid colon and pelvis and everything looked fine.  Pneumoperitoneum was released and the trochars were removed.  The fascia of the umbilicus was closed with 0 Vicryl sutures.  Skin incisions were closed with subcuticular sutures of 4-0 Monocryl and Dermabond.  Patient tolerated the procedure well was taken to PACU in stable condition.  EBL 30 mL.  Counts correct.  Complications none.     Angelia Mould. Derrell Lolling, M.D., FACS General and Minimally Invasive  Surgery Breast and Colorectal Surgery  01/28/2015 5:18 PM

## 2015-01-29 NOTE — Progress Notes (Signed)
1 Day Post-Op  Subjective: Tenderness much improve from pre-op but still having incisional pain - needing IV pain meds No nausea or vomiting  Objective: Vital signs in last 24 hours: Temp:  [98.3 F (36.8 C)-99.9 F (37.7 C)] 98.5 F (36.9 C) (09/10 0610) Pulse Rate:  [63-97] 63 (09/10 0610) Resp:  [16-20] 18 (09/10 0610) BP: (121-175)/(60-91) 123/74 mmHg (09/10 0610) SpO2:  [97 %-100 %] 99 % (09/10 0610) Weight:  [90.719 kg (200 lb)] 90.719 kg (200 lb) (09/09 1544)    Intake/Output from previous day: 09/09 0701 - 09/10 0700 In: 2895.8 [I.V.:2895.8] Out: 775 [Urine:725; Blood:50] Intake/Output this shift:    General appearance: alert, cooperative and no distress Resp: clear to auscultation bilaterally Cardio: regular rate and rhythm, S1, S2 normal, no murmur, click, rub or gallop GI: soft; hypoactive bowel sounds; tender around incisions; minimal RLQ tenderness Incisions c/d/i  Lab Results:   Recent Labs  01/28/15 1022  WBC 17.4*  HGB 15.1  HCT 43.4  PLT 202   BMET  Recent Labs  01/28/15 1022  NA 138  K 3.1*  CL 100*  CO2 27  GLUCOSE 113*  BUN 12  CREATININE 1.14  CALCIUM 9.4   PT/INR No results for input(s): LABPROT, INR in the last 72 hours. ABG No results for input(s): PHART, HCO3 in the last 72 hours.  Invalid input(s): PCO2, PO2  Studies/Results: Ct Abdomen Pelvis W Contrast  01/28/2015   CLINICAL DATA:  Pt states has had severe abdominal pain. Mid/lower. Vomiting. Not able to eat. Abdominal distension. RLQ pain since yesterday  EXAM: CT ABDOMEN AND PELVIS WITH CONTRAST  TECHNIQUE: Multidetector CT imaging of the abdomen and pelvis was performed using the standard protocol following bolus administration of intravenous contrast.  CONTRAST:  OMNIPAQUE IOHEXOL 300 MG/ML SOLN, 50mL OMNIPAQUE IOHEXOL 300 MG/ML SOLN  COMPARISON:  None.  FINDINGS: Lower chest:  Minimal left base atelectasis  Hepatobiliary: Negative  Pancreas: Negative  Spleen:  Negative  Adrenals/Urinary Tract: 1 mm nonobstructing left lower pole renal stone. Otherwise negative.  Stomach/Bowel: There is an 11 mm stone in the appendix. The appendix also contains debris. The appendix is dilated to about 2 cm in diameter. There is some free fluid around the appendix as well as moderate inflammatory change. No loculated fluid collection or abscess is identified. There is no free air.  Vascular/Lymphatic: Negative  Reproductive: Negative  Other: None  Musculoskeletal: No acute findings  IMPRESSION: Acute appendicitis as described above   Electronically Signed   By: Esperanza Heir M.D.   On: 01/28/2015 12:20    Anti-infectives: Anti-infectives    Start     Dose/Rate Route Frequency Ordered Stop   01/28/15 2100  piperacillin-tazobactam (ZOSYN) IVPB 3.375 g  Status:  Discontinued     3.375 g 12.5 mL/hr over 240 Minutes Intravenous 3 times per day 01/28/15 1859 01/28/15 1905   01/28/15 1330  piperacillin-tazobactam (ZOSYN) IVPB 3.375 g     3.375 g 12.5 mL/hr over 240 Minutes Intravenous 3 times per day 01/28/15 1325     01/28/15 1245  piperacillin-tazobactam (ZOSYN) IVPB 3.375 g     3.375 g 100 mL/hr over 30 Minutes Intravenous  Once 01/28/15 1241 01/28/15 1323      Assessment/Plan: s/p Procedure(s): APPENDECTOMY LAPAROSCOPIC (N/A) Continue IV abx today - transition to PO tomorrow PO pain meds Encourage ambulation Probably discharge tomorrow.     Quincie Haroon K. 01/29/2015

## 2015-01-30 MED ORDER — OXYCODONE-ACETAMINOPHEN 5-325 MG PO TABS: 1.0000 | ORAL_TABLET | ORAL | Status: DC | PRN

## 2015-01-30 MED ORDER — AMOXICILLIN-POT CLAVULANATE 875-125 MG PO TABS: 1.0000 | ORAL_TABLET | Freq: Two times a day (BID) | ORAL | Status: DC

## 2015-01-30 NOTE — Discharge Summary (Signed)
Physician Discharge Summary  Patient ID: Kevin Combs MRN: [id number] DOB/AGE: 40-Dec-1977 40 y.o.  Admit date: 01/28/2015 Discharge date: 01/30/2015  Admission Diagnoses:  Discharge Diagnoses:  Principal Problem:   Acute appendicitis Active Problems:   Acute appendicitis with localized peritonitis   Discharged Condition: good  Hospital Course: Kevin Combs came in for abdominal pain, was found to have appendicitis and underwent lap appendectomy. Post op course went well, he was advanced on diet, pain resolved. He was ambulating well prior to discharge  Consults: None and general surgery  Significant Diagnostic Studies: WBC 17 preop  Treatments: lap appy, IV fluids, IV abx  Discharge Exam: Blood pressure 148/83, pulse 51, temperature 98 F (36.7 C), temperature source Oral, resp. rate 17, height  (1.803 m), weight 90.719 kg (200 lb), SpO2 100 %. General appearance: alert, cooperative, appears stated age and no distress Resp: clear to auscultation bilaterally Cardio: regular rate and rhythm, S1, S2 normal, no murmur, click, rub or gallop GI: soft, non-tender; bowel sounds normal; no masses,  no organomegaly and wound c/d/i no erythema  Disposition: 01-Home or Self Care  Discharge Instructions    Diet - low sodium heart healthy    Complete by:  As directed      Increase activity slowly    Complete by:  As directed             Medication List    TAKE these medications        amoxicillin-clavulanate 875-125 MG per tablet  Commonly known as:  AUGMENTIN  Take 1 tablet by mouth every 12 (twelve) hours.     oxyCODONE-acetaminophen 5-325 MG per tablet  Commonly known as:  PERCOCET/ROXICET  Take 1-2 tablets by mouth every 4 (four) hours as needed for moderate pain.           Follow-up Information    Follow up with Ernestene Mention, MD In 2 weeks.   Specialty:  General Surgery   Contact information:   [address] [place] [address] [phone]        Signed: De Blanch Delphine Sizemore 01/30/2015, 8:43 AM

## 2015-01-30 NOTE — Progress Notes (Signed)
Patient discharged home.  Accompanied by son.  No complaints.  Room air.  Leaving with two prescriptions.  No s/s of pain or distress.  Patient left the floor at approximately 1103.

## 2015-01-31 ENCOUNTER — Encounter (HOSPITAL_COMMUNITY): Payer: Self-pay | Admitting: General Surgery

## 2017-06-24 ENCOUNTER — Encounter (HOSPITAL_COMMUNITY): Payer: Self-pay | Admitting: Emergency Medicine

## 2017-06-24 ENCOUNTER — Emergency Department (HOSPITAL_COMMUNITY): Payer: Self-pay

## 2017-06-24 DIAGNOSIS — Y929 Unspecified place or not applicable: Secondary | ICD-10-CM | POA: Insufficient documentation

## 2017-06-24 DIAGNOSIS — Y9389 Activity, other specified: Secondary | ICD-10-CM | POA: Insufficient documentation

## 2017-06-24 DIAGNOSIS — S76012A Strain of muscle, fascia and tendon of left hip, initial encounter: Secondary | ICD-10-CM | POA: Insufficient documentation

## 2017-06-24 DIAGNOSIS — Y999 Unspecified external cause status: Secondary | ICD-10-CM | POA: Insufficient documentation

## 2017-06-24 DIAGNOSIS — X500XXA Overexertion from strenuous movement or load, initial encounter: Secondary | ICD-10-CM | POA: Insufficient documentation

## 2017-06-24 DIAGNOSIS — F17290 Nicotine dependence, other tobacco product, uncomplicated: Secondary | ICD-10-CM | POA: Insufficient documentation

## 2017-06-24 MED ORDER — OXYCODONE-ACETAMINOPHEN 5-325 MG PO TABS
1.0000 | ORAL_TABLET | ORAL | Status: DC | PRN
  Administered 2017-06-24: 1 via ORAL
  Filled 2017-06-24: qty 1

## 2017-06-24 NOTE — ED Notes (Signed)
Pain medication given in Triage. Patient advised about side effects of medications and  to avoid driving for a minimum of 4 hours.  

## 2017-06-24 NOTE — ED Triage Notes (Addendum)
Patient presents with family stating he was getting out of truck and his left leg gave out of him causing him to fall to the ground. C/o left hip and thigh pain. Good sensation to lower extremity. No obvious deformity noted, however pt sitting in wheelchair.

## 2017-06-25 ENCOUNTER — Emergency Department (HOSPITAL_COMMUNITY): Payer: Self-pay

## 2017-06-25 ENCOUNTER — Emergency Department (HOSPITAL_COMMUNITY)
Admission: EM | Admit: 2017-06-25 | Discharge: 2017-06-25 | Disposition: A | Discharge status: Home / Self Care | Payer: Self-pay | Attending: Emergency Medicine | Admitting: Emergency Medicine

## 2017-06-25 ENCOUNTER — Encounter (HOSPITAL_COMMUNITY): Payer: Self-pay | Admitting: Radiology

## 2017-06-25 DIAGNOSIS — S76012A Strain of muscle, fascia and tendon of left hip, initial encounter: Secondary | ICD-10-CM

## 2017-06-25 MED ORDER — OXYCODONE-ACETAMINOPHEN 5-325 MG PO TABS
1.0000 | ORAL_TABLET | Freq: Once | ORAL | Status: AC
  Administered 2017-06-25: 1 via ORAL
  Filled 2017-06-25: qty 1

## 2017-06-25 MED ORDER — HYDROCODONE-ACETAMINOPHEN 5-325 MG PO TABS: 1.0000 | ORAL_TABLET | Freq: Four times a day (QID) | ORAL | 0 refills | Status: DC | PRN

## 2017-06-25 MED ORDER — CYCLOBENZAPRINE HCL 10 MG PO TABS: 10.0000 mg | ORAL_TABLET | Freq: Two times a day (BID) | ORAL | 0 refills | Status: DC | PRN

## 2017-06-25 MED ORDER — NAPROXEN 500 MG PO TABS: 500.0000 mg | ORAL_TABLET | Freq: Two times a day (BID) | ORAL | 0 refills | Status: DC

## 2017-06-25 NOTE — ED Provider Notes (Signed)
[facility]-EMERGENCY DEPT Provider Note   CSN: [id number] Arrival date & time: 06/24/17  2053     History   Chief Complaint Chief Complaint  Patient presents with  . Hip Injury    HPI Kevin Combs is a 42 y.o. male.  HPI Kevin Combs is a 42 y.o. male presents to emergency department with complaint of left hip injury.  Patient states he was coming out of his truck and stepped on his leg wrong way, states his hip gave out and he fell to the ground.  Since then he is unable to move his hip or bear any weight.  He reports pain is in the anterior hip and radiates into the quad.  Denies any injury to the ankle or the knee.  He was given a Percocet in the emergency department overnight.  He states he did help his pain.  He denies any other complaints at this time.  No prior hip injuries.  No numbness or weakness distal to the hip  History reviewed. No pertinent past medical history.  Patient Active Problem List   Diagnosis Date Noted  . Acute appendicitis 01/28/2015  . Acute appendicitis with localized peritonitis 01/28/2015    Past Surgical History:  Procedure Laterality Date  . KNEE SURGERY Right 1992  . LAPAROSCOPIC APPENDECTOMY N/A 01/28/2015   Procedure: APPENDECTOMY LAPAROSCOPIC;  Surgeon: Claud Kelp, MD;  Location: [facility] ORS;  Service: General;  Laterality: N/A;       Home Medications    Prior to Admission medications   Medication Sig Start Date End Date Taking? Authorizing Provider  ibuprofen (ADVIL,MOTRIN) 200 MG tablet Take 400 mg by mouth every 6 (six) hours as needed for moderate pain.   Yes [provider]  amoxicillin-clavulanate (AUGMENTIN) 875-125 MG per tablet Take 1 tablet by mouth every 12 (twelve) hours. Patient not taking: Reported on 06/25/2017 01/30/15   Kinsinger, De Blanch, MD  oxyCODONE-acetaminophen (PERCOCET/ROXICET) 5-325 MG per tablet Take 1-2 tablets by mouth every 4 (four) hours as needed for moderate pain. Patient  not taking: Reported on 06/25/2017 01/30/15   Kinsinger, De Blanch, MD    Family History No family history on file.  Social History Social History   Tobacco Use  . Smoking status: Current Every Day Smoker    Types: Cigars  Substance Use Topics  . Alcohol use: Yes    Comment: social  . Drug use: Yes    Types: Marijuana     Allergies   Patient has no known allergies.   Review of Systems Review of Systems  Constitutional: Negative for chills and fever.  Respiratory: Negative for cough, chest tightness and shortness of breath.   Cardiovascular: Negative for chest pain, palpitations and leg swelling.  Gastrointestinal: Negative for abdominal distention, abdominal pain, diarrhea, nausea and vomiting.  Genitourinary: Negative for dysuria, frequency, hematuria and urgency.  Musculoskeletal: Positive for arthralgias and myalgias. Negative for neck pain and neck stiffness.  Skin: Negative for rash.  Allergic/Immunologic: Negative for immunocompromised state.  Neurological: Negative for dizziness, weakness, light-headedness, numbness and headaches.  All other systems reviewed and are negative.    Physical Exam Updated Vital Signs BP (!) 147/91   Pulse 89   Temp 99.3 F (37.4 C) (Oral)   Resp 16   SpO2 100%   Physical Exam  Constitutional: He appears well-developed and well-nourished. No distress.  HENT:  Head: Normocephalic and atraumatic.  Eyes: Conjunctivae are normal.  Neck: Neck supple.  Cardiovascular: Normal rate, regular  rhythm and normal heart sounds.  Pulmonary/Chest: Effort normal. No respiratory distress. He has no wheezes. He has no rales.  Abdominal: Soft. Bowel sounds are normal. He exhibits no distension. There is no tenderness. There is no rebound.  Musculoskeletal: He exhibits no edema.  Tenderness to palpation of the lateral and anterior hip joint.  Patient unable to move his hip actively or passively.  It seems like he has more pain when flexing it  actively.  Tenderness extends into the proximal quadricep.  There is no bruising or swelling at this time.  Patella tendon intact, full range of motion of the knee and ankle.  Distal pulses intact.  Neurological: He is alert.  Skin: Skin is warm and dry.  Nursing note and vitals reviewed.    ED Treatments / Results  Labs (all labs ordered are listed, but only abnormal results are displayed) Labs Reviewed - No data to display  EKG  EKG Interpretation None       Radiology Dg Hip Unilat With Pelvis 2-3 Views Left  Result Date: 06/24/2017 CLINICAL DATA:  Left hip pain with limited motion. EXAM: DG HIP (WITH OR WITHOUT PELVIS) 2-3V LEFT COMPARISON:  None. FINDINGS: Evaluation of the left hip is somewhat limited as the left hip is internally rotated on both frontal views. The femoral head sits in the acetabulum with no evidence of dislocation. There is no evidence of hip fracture. Remainder of the pelvis is normal. IMPRESSION: Evaluation of the left hip is somewhat limited as the left hip is internally rotated on both frontal views. Within this limitation, there is no evidence of fracture. No dislocation. If concern persists, an MRI could further evaluate. Electronically Signed   By: Gerome Samavid  Williams III M.D   On: 06/24/2017 23:00   Dg Femur 1v Left  Result Date: 06/24/2017 CLINICAL DATA:  Single view of the distal left femur. EXAM: LEFT FEMUR 1 VIEW COMPARISON:  None. FINDINGS: There is no evidence of fracture or other focal bone lesions. Soft tissues are unremarkable. IMPRESSION: Negative. Electronically Signed   By: Gerome Samavid  Williams III M.D   On: 06/24/2017 23:00    Procedures Procedures (including critical care time)  Medications Ordered in ED Medications  oxyCODONE-acetaminophen (PERCOCET/ROXICET) 5-325 MG per tablet 1 tablet (1 tablet Oral Given 06/24/17 2212)  oxyCODONE-acetaminophen (PERCOCET/ROXICET) 5-325 MG per tablet 1 tablet (1 tablet Oral Given 06/25/17 0717)     Initial  Impression / Assessment and Plan / ED Course  I have reviewed the triage vital signs and the nursing notes.  Pertinent labs & imaging results that were available during my care of the patient were reviewed by me and considered in my medical decision making (see chart for details).    Patient with left hip injury, x-ray obtained overnight shows limited evaluation due to positioning of the hip.  I will get CT of the hip given patient is unable to bear weight or move it.  Will give another Percocet for pain   9:05 AM Patient states he is negative, other than nonspecific left hip effusion.  Discussed results with patient, discussed possibility of soft tissue injury.  Will give crutches.  Will provide with pain medications and muscle relaxants at home.  Will have him follow-up with orthopedics for further evaluation.  Return precautions discussed.  He is neurovascularly intact otherwise.  Vitals:   06/24/17 2216  BP: (!) 147/91  Pulse: 89  Resp: 16  Temp: 99.3 F (37.4 C)  TempSrc: Oral  SpO2: 100%  Final Clinical Impressions(s) / ED Diagnoses   Final diagnoses:  Strain of left hip, initial encounter    ED Discharge Orders        Ordered    naproxen (NAPROSYN) 500 MG tablet  2 times daily     06/25/17 0903    HYDROcodone-acetaminophen (NORCO) 5-325 MG tablet  Every 6 hours PRN     06/25/17 0903    cyclobenzaprine (FLEXERIL) 10 MG tablet  2 times daily PRN     06/25/17 0903       Jaynie Crumble, PA-C 06/25/17 1610    Pricilla Loveless, MD 06/25/17 1235

## 2017-06-25 NOTE — Discharge Instructions (Signed)
Your CT scan of the hip showed joint effusion, however no definite fractures.  It is possible that you injured ligaments or muscles around her hip joint.  Use crutches for ambulation.  Ice your hip several times a day.  Naprosyn for pain.  Norco for severe pain.  Flexeril for muscle spasms.  If not improving in the next 2 days, please follow-up with orthopedic specialist as referred.

## 2017-06-25 NOTE — ED Notes (Signed)
Bed: WTR9 Expected date:  Expected time:  Means of arrival:  Comments: 

## 2017-12-08 ENCOUNTER — Encounter (HOSPITAL_COMMUNITY): Payer: Self-pay

## 2017-12-08 ENCOUNTER — Emergency Department (HOSPITAL_COMMUNITY): Payer: Self-pay

## 2017-12-08 ENCOUNTER — Emergency Department (HOSPITAL_COMMUNITY)
Admission: EM | Admit: 2017-12-08 | Discharge: 2017-12-08 | Disposition: A | Discharge status: Home / Self Care | Payer: Self-pay | Attending: Emergency Medicine | Admitting: Emergency Medicine

## 2017-12-08 ENCOUNTER — Other Ambulatory Visit: Payer: Self-pay

## 2017-12-08 DIAGNOSIS — M25461 Effusion, right knee: Secondary | ICD-10-CM | POA: Insufficient documentation

## 2017-12-08 DIAGNOSIS — M25561 Pain in right knee: Secondary | ICD-10-CM

## 2017-12-08 DIAGNOSIS — Y92812 Truck as the place of occurrence of the external cause: Secondary | ICD-10-CM | POA: Insufficient documentation

## 2017-12-08 DIAGNOSIS — Z87891 Personal history of nicotine dependence: Secondary | ICD-10-CM | POA: Insufficient documentation

## 2017-12-08 DIAGNOSIS — Z79899 Other long term (current) drug therapy: Secondary | ICD-10-CM | POA: Insufficient documentation

## 2017-12-08 DIAGNOSIS — Y9389 Activity, other specified: Secondary | ICD-10-CM | POA: Insufficient documentation

## 2017-12-08 DIAGNOSIS — S8391XA Sprain of unspecified site of right knee, initial encounter: Secondary | ICD-10-CM | POA: Insufficient documentation

## 2017-12-08 DIAGNOSIS — W108XXA Fall (on) (from) other stairs and steps, initial encounter: Secondary | ICD-10-CM | POA: Insufficient documentation

## 2017-12-08 DIAGNOSIS — Y990 Civilian activity done for income or pay: Secondary | ICD-10-CM | POA: Insufficient documentation

## 2017-12-08 NOTE — ED Triage Notes (Signed)
PT C/O RIGHT KNEE PAIN AND SWELLING SINCE Friday. 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 PT DENIES ANY OTHER INJURIES.

## 2017-12-08 NOTE — ED Notes (Signed)
Patient transported to X-ray 

## 2017-12-08 NOTE — Discharge Instructions (Addendum)
Wear knee sleeve for at least 2 weeks for stabilization of knee. Use your home crutches as needed for comfort. Ice and elevate knee throughout the day, using ice pack for no more than 20 minutes every hour. Alternate between tylenol and ibuprofen for pain relief. Follow up with your orthopedist in 1 week for recheck of symptoms and ongoing management of your knee issue. Return to the ER for changes or worsening symptoms.

## 2017-12-08 NOTE — ED Notes (Signed)
PT REFUSED ICE PACKS, STS HE HAS ON A KNEE BRACE, AND DOES NOT NEED IT.

## 2017-12-08 NOTE — ED Provider Notes (Addendum)
[facility]-EMERGENCY DEPT Provider Note   CSN: [id number] Arrival date & time: 12/08/17  1246     History   Chief Complaint Chief Complaint  Patient presents with  . Knee Pain    RIGHT    HPI Kevin Combs is a 42 y.o. otherwise healthy male with a PSHx of R knee ACL repair surgery in the 1990s by Dr. Eulah Pont, who presents to the ED with complaints of right knee pain and swelling x2 days.  Patient states that he cannot recall any specific injury to the knee, he reports that he stepped out of his work truck on Friday and after that he started having pain.  He describes the pain as 8/10 constant sharp nonradiating right knee pain that worsens with movement of the knee or weightbearing, has been somewhat improved with wearing a knee brace and using ice, but unrelieved with ibuprofen and naproxen.  He reports associated swelling.  He denies any erythema, warmth, bruising, numbness, tingling, focal weakness, or any other complaints at this time.  He had an ACL repair by Dr. Eulah Pont in the 1990s, but he continues to be a patient of [facility].  The history is provided by the patient and medical records. No language interpreter was used.    History reviewed. No pertinent past medical history.  Patient Active Problem List   Diagnosis Date Noted  . Acute appendicitis 01/28/2015  . Acute appendicitis with localized peritonitis 01/28/2015    Past Surgical History:  Procedure Laterality Date  . KNEE SURGERY Right 1992  . LAPAROSCOPIC APPENDECTOMY N/A 01/28/2015   Procedure: APPENDECTOMY LAPAROSCOPIC;  Surgeon: Claud Kelp, MD;  Location: [facility]S;  Service: General;  Laterality: N/A;        Home Medications    Prior to Admission medications   Medication Sig Start Date End Date Taking? Authorizing Provider  amoxicillin-clavulanate (AUGMENTIN) 875-125 MG per tablet Take 1 tablet by mouth every 12 (twelve) hours. Patient not taking: Reported on 06/25/2017  01/30/15   Kinsinger, De Blanch, MD  cyclobenzaprine (FLEXERIL) 10 MG tablet Take 1 tablet (10 mg total) by mouth 2 (two) times daily as needed for muscle spasms. 06/25/17   Kirichenko, Lemont Fillers, PA-C  HYDROcodone-acetaminophen (NORCO) 5-325 MG tablet Take 1 tablet by mouth every 6 (six) hours as needed for moderate pain. 06/25/17   Kirichenko, Tatyana, PA-C  ibuprofen (ADVIL,MOTRIN) 200 MG tablet Take 400 mg by mouth every 6 (six) hours as needed for moderate pain.    [provider]  naproxen (NAPROSYN) 500 MG tablet Take 1 tablet (500 mg total) by mouth 2 (two) times daily. 06/25/17   Kirichenko, Lemont Fillers, PA-C  oxyCODONE-acetaminophen (PERCOCET/ROXICET) 5-325 MG per tablet Take 1-2 tablets by mouth every 4 (four) hours as needed for moderate pain. Patient not taking: Reported on 06/25/2017 01/30/15   Kinsinger, De Blanch, MD    Family History History reviewed. No pertinent family history.  Social History Social History   Tobacco Use  . Smoking status: Former Smoker    Types: Cigars  . Smokeless tobacco: Never Used  Substance Use Topics  . Alcohol use: Yes    Comment: social  . Drug use: Yes    Types: Marijuana     Allergies   Patient has no known allergies.   Review of Systems Review of Systems  Musculoskeletal: Positive for arthralgias and joint swelling.  Skin: Negative for color change.  Allergic/Immunologic: Negative for immunocompromised state.  Neurological: Negative for weakness and numbness.  Psychiatric/Behavioral:  Negative for confusion.     Physical Exam Updated Vital Signs BP (!) 137/92 (BP Location: Left Arm)   Pulse 80   Temp 98.6 F (37 C) (Oral)   Resp 17   Ht 5\' 11"  (1.803 m)   Wt 101.4 kg (223 lb 9.6 oz)   SpO2 100%   BMI 31.19 kg/m   Physical Exam  Constitutional: He is oriented to person, place, and time. Vital signs are normal. He appears well-developed and well-nourished.  Non-toxic appearance. No distress.  Afebrile, nontoxic, NAD    HENT:  Head: Normocephalic and atraumatic.  Mouth/Throat: Mucous membranes are normal.  Eyes: Conjunctivae and EOM are normal. Right eye exhibits no discharge. Left eye exhibits no discharge.  Neck: Normal range of motion. Neck supple.  Cardiovascular: Normal rate and intact distal pulses.  Pulmonary/Chest: Effort normal. No respiratory distress.  Abdominal: Normal appearance. He exhibits no distension.  Musculoskeletal:       Right knee: He exhibits swelling and effusion. He exhibits normal range of motion, no ecchymosis, no deformity, no laceration, no erythema, normal alignment, no LCL laxity, normal patellar mobility and no MCL laxity. Tenderness found. Medial joint line tenderness noted.  R knee with FROM intact, with moderate medial joint line TTP, slight swelling/effusion medially, no crepitus or deformity, no bruising or erythema, no warmth, no abnormal alignment or patellar mobility, no varus/valgus laxity, neg anterior drawer test.  Strength and sensation grossly intact, distal pulses intact, compartments soft   Neurological: He is alert and oriented to person, place, and time. He has normal strength. No sensory deficit.  Skin: Skin is warm, dry and intact. No rash noted.  Psychiatric: He has a normal mood and affect.  Nursing note and vitals reviewed.    ED Treatments / Results  Labs (all labs ordered are listed, but only abnormal results are displayed) Labs Reviewed - No data to display  EKG None  Radiology Dg Knee Complete 4 Views Right  Result Date: 12/08/2017 CLINICAL DATA:  Anterior and medial right knee pain for 3 days. Remote knee surgery. Initial encounter. EXAM: RIGHT KNEE - COMPLETE 4+ VIEW COMPARISON:  None. FINDINGS: No acute fracture or dislocation is identified. There is likely a small knee joint effusion. Femorotibial joint space widths are preserved. Interference screws are noted in the proximal tibia and along the margin of the medial femoral condyle.  IMPRESSION: No acute osseous abnormality. Small knee joint effusion. Electronically Signed   By: Sebastian Ache M.D.   On: 12/08/2017 13:19    Procedures Procedures (including critical care time)  Medications Ordered in ED Medications - No data to display   Initial Impression / Assessment and Plan / ED Course  I have reviewed the triage vital signs and the nursing notes.  Pertinent labs & imaging results that were available during my care of the patient were reviewed by me and considered in my medical decision making (see chart for details).     42 y.o. male here with R knee pain/swelling x2 days after stepping out of work truck. On exam, mild swelling medially, with moderate TTP along medial joint line, NVI with soft compartments, no laxity of the joint, no erythema/warmth. Doubt septic joint or gout. Xray pending. Will reassess shortly.   1:39 PM Xray negative for acute osseous abnormality, small knee joint effusion noted. On my review of the images, the medial screw from his ACL repair has no bone around the lower aspect of the screw, unclear if this is normal or  if this indicates screw is coming loose; no mention of issue on xray. This correlates to the area of pain, but again it's unclear if this is actually an acute process or not. Likely knee sprain vs possible meniscal injury. Pt has knee sleeve which appears appropriate, pt has crutches at home, advised use of these for comfort, advised RICE, tylenol/motrin for pain, and f/up with his orthopedist in 1wk for recheck and to review whether his ACL repair screws are needing any intervention; doubt need for emergent ortho consult today, or further emergent work up. I explained the diagnosis and have given explicit precautions to return to the ER including for any other new or worsening symptoms. The patient understands and accepts the medical plan as it's been dictated and I have answered their questions. Discharge instructions concerning home  care and prescriptions have been given. The patient is STABLE and is discharged to home in good condition.    Final Clinical Impressions(s) / ED Diagnoses   Final diagnoses:  Acute pain of right knee  Pain and swelling of right knee  Sprain of right knee, unspecified ligament, initial encounter    ED Discharge Orders    [address]None         Avielle Imbert, [place]altagMercedes, [place] JerseyPA-C 12/08/17 1340    Bethann BerkshireZammit, Joseph, MD 12/08/17 1541

## 2019-10-26 ENCOUNTER — Ambulatory Visit: Payer: Self-pay | Admitting: Family Medicine

## 2019-10-26 DIAGNOSIS — Z0289 Encounter for other administrative examinations: Secondary | ICD-10-CM

## 2019-10-27 ENCOUNTER — Telehealth: Payer: Self-pay | Admitting: General Practice

## 2019-10-27 NOTE — Telephone Encounter (Signed)
Not with me please. Ty.

## 2019-10-27 NOTE — Telephone Encounter (Signed)
Patient missed new patient appointment yesterday 10/26/2019, patient thought appointment was today at 2:45. Please Advise if we can reschedule patient for New Patient appointment in the future.

## 2019-12-22 ENCOUNTER — Encounter: Payer: Self-pay | Admitting: Family Medicine

## 2019-12-22 ENCOUNTER — Ambulatory Visit (INDEPENDENT_AMBULATORY_CARE_PROVIDER_SITE_OTHER): Payer: 59 | Admitting: Family Medicine

## 2019-12-22 ENCOUNTER — Other Ambulatory Visit: Payer: Self-pay

## 2019-12-22 VITALS — BP 138/100 | HR 59 | Temp 98.5°F | Ht 71.0 in | Wt 218.0 lb

## 2019-12-22 DIAGNOSIS — L918 Other hypertrophic disorders of the skin: Secondary | ICD-10-CM

## 2019-12-22 DIAGNOSIS — F4310 Post-traumatic stress disorder, unspecified: Secondary | ICD-10-CM

## 2019-12-22 DIAGNOSIS — F411 Generalized anxiety disorder: Secondary | ICD-10-CM

## 2019-12-22 DIAGNOSIS — L723 Sebaceous cyst: Secondary | ICD-10-CM

## 2019-12-22 DIAGNOSIS — R030 Elevated blood-pressure reading, without diagnosis of hypertension: Secondary | ICD-10-CM

## 2019-12-22 NOTE — Patient Instructions (Addendum)
Please consider counseling. Contact [phone] to schedule an appointment or inquire about cost/insurance coverage.  Continue checking your blood pressure at home.   Coping skills Choose 5 that work for you:  Take a deep breath  Count to 20  Read a book  Do a puzzle  Meditate  Bake  Sing  Knit  Garden  Pray  Go outside  Call a friend  Listen to music  Take a walk  Color  Send a note  Take a bath  Watch a movie  Be alone in a quiet place  Pet an animal  Visit a friend  Journal  Exercise  Stretch   Aim to do some physical exertion for 150 minutes per week. This is typically divided into 5 days per week, 30 minutes per day. The activity should be enough to get your heart rate up. Anything is better than nothing if you have time constraints.  Call your insurance company to see if they cover painful skin tag removal.   Let us know if you need anything.   DASH Eating Plan DASH stands for "Dietary Approaches to Stop Hypertension." The DASH eating plan is a healthy eating plan that has been shown to reduce high blood pressure (hypertension). It may also reduce your risk for type 2 diabetes, heart disease, and stroke. The DASH eating plan may also help with weight loss. What are tips for following this plan?  General guidelines  Avoid eating more than 2,300 mg (milligrams) of salt (sodium) a day. If you have hypertension, you may need to reduce your sodium intake to 1,500 mg a day.  Limit alcohol intake to no more than 1 drink a day for nonpregnant women and 2 drinks a day for men. One drink equals 12 oz of beer, 5 oz of wine, or 1 oz of hard liquor.  Work with your health care provider to maintain a healthy body weight or to lose weight. Ask what an ideal weight is for you.  Get at least 30 minutes of exercise that causes your heart to beat faster (aerobic exercise) most days of the week. Activities may include walking, swimming, or biking.  Work  with your health care provider or diet and nutrition specialist (dietitian) to adjust your eating plan to your individual calorie needs. Reading food labels   Check food labels for the amount of sodium per serving. Choose foods with less than 5 percent of the Daily Value of sodium. Generally, foods with less than 300 mg of sodium per serving fit into this eating plan.  To find whole grains, look for the word "whole" as the first word in the ingredient list. Shopping  Buy products labeled as "low-sodium" or "no salt added."  Buy fresh foods. Avoid canned foods and premade or frozen meals. Cooking  Avoid adding salt when cooking. Use salt-free seasonings or herbs instead of table salt or sea salt. Check with your health care provider or pharmacist before using salt substitutes.  Do not fry foods. Cook foods using healthy methods such as baking, boiling, grilling, and broiling instead.  Cook with heart-healthy oils, such as olive, canola, soybean, or sunflower oil. Meal planning  Eat a balanced diet that includes: ? 5 or more servings of fruits and vegetables each day. At each meal, try to fill half of your plate with fruits and vegetables. ? Up to 6-8 servings of whole grains each day. ? Less than 6 oz of lean meat, poultry, or fish each day. A 3-oz  serving of meat is about the same size as a deck of cards. One egg equals 1 oz. ? 2 servings of low-fat dairy each day. ? A serving of nuts, seeds, or beans 5 times each week. ? Heart-healthy fats. Healthy fats called Omega-3 fatty acids are found in foods such as flaxseeds and coldwater fish, like sardines, salmon, and mackerel.  Limit how much you eat of the following: ? Canned or prepackaged foods. ? Food that is high in trans fat, such as fried foods. ? Food that is high in saturated fat, such as fatty meat. ? Sweets, desserts, sugary drinks, and other foods with added sugar. ? Full-fat dairy products.  Do not salt foods before  eating.  Try to eat at least 2 vegetarian meals each week.  Eat more home-cooked food and less restaurant, buffet, and fast food.  When eating at a restaurant, ask that your food be prepared with less salt or no salt, if possible. What foods are recommended? The items listed may not be a complete list. Talk with your dietitian about what dietary choices are best for you. Grains Whole-grain or whole-wheat bread. Whole-grain or whole-wheat pasta. Brown rice. Orpah Cobb. Bulgur. Whole-grain and low-sodium cereals. Pita bread. Low-fat, low-sodium crackers. Whole-wheat flour tortillas. Vegetables Fresh or frozen vegetables (raw, steamed, roasted, or grilled). Low-sodium or reduced-sodium tomato and vegetable juice. Low-sodium or reduced-sodium tomato sauce and tomato paste. Low-sodium or reduced-sodium canned vegetables. Fruits All fresh, dried, or frozen fruit. Canned fruit in natural juice (without added sugar). Meat and other protein foods Skinless chicken or [place]. Ground chicken or [place]. Pork with fat trimmed off. Fish and seafood. Egg whites. Dried beans, peas, or lentils. Unsalted nuts, nut butters, and seeds. Unsalted canned beans. Lean cuts of beef with fat trimmed off. Low-sodium, lean deli meat. Dairy Low-fat (1%) or fat-free (skim) milk. Fat-free, low-fat, or reduced-fat cheeses. Nonfat, low-sodium ricotta or cottage cheese. Low-fat or nonfat yogurt. Low-fat, low-sodium cheese. Fats and oils Soft margarine without trans fats. Vegetable oil. Low-fat, reduced-fat, or light mayonnaise and salad dressings (reduced-sodium). Canola, safflower, olive, soybean, and sunflower oils. Avocado. Seasoning and other foods Herbs. Spices. Seasoning mixes without salt. Unsalted popcorn and pretzels. Fat-free sweets. What foods are not recommended? The items listed may not be a complete list. Talk with your dietitian about what dietary choices are best for you. Grains Baked goods made with fat,  such as croissants, muffins, or some breads. Dry pasta or rice meal packs. Vegetables Creamed or fried vegetables. Vegetables in a cheese sauce. Regular canned vegetables (not low-sodium or reduced-sodium). Regular canned tomato sauce and paste (not low-sodium or reduced-sodium). Regular tomato and vegetable juice (not low-sodium or reduced-sodium). Rosita Fire. Olives. Fruits Canned fruit in a light or heavy syrup. Fried fruit. Fruit in cream or butter sauce. Meat and other protein foods Fatty cuts of meat. Ribs. Fried meat. Tomasa Blase. Sausage. Bologna and other processed lunch meats. Salami. Fatback. Hotdogs. Bratwurst. Salted nuts and seeds. Canned beans with added salt. Canned or smoked fish. Whole eggs or egg yolks. Chicken or [place] with skin. Dairy Whole or 2% milk, cream, and half-and-half. Whole or full-fat cream cheese. Whole-fat or sweetened yogurt. Full-fat cheese. Nondairy creamers. Whipped toppings. Processed cheese and cheese spreads. Fats and oils Butter. Stick margarine. Lard. Shortening. Ghee. Bacon fat. Tropical oils, such as coconut, palm kernel, or palm oil. Seasoning and other foods Salted popcorn and pretzels. Onion salt, garlic salt, seasoned salt, table salt, and sea salt. Worcestershire sauce. Tartar sauce. Barbecue  sauce. Teriyaki sauce. Soy sauce, including reduced-sodium. Steak sauce. Canned and packaged gravies. Fish sauce. Oyster sauce. Cocktail sauce. Horseradish that you find on the shelf. Ketchup. Mustard. Meat flavorings and tenderizers. Bouillon cubes. Hot sauce and Tabasco sauce. Premade or packaged marinades. Premade or packaged taco seasonings. Relishes. Regular salad dressings. Where to find more information:  National Heart, Lung, and Blood Institute: PopSteam.is  American Heart Association: www.heart.org Summary  The DASH eating plan is a healthy eating plan that has been shown to reduce high blood pressure (hypertension). It may also reduce your risk for  type 2 diabetes, heart disease, and stroke.  With the DASH eating plan, you should limit salt (sodium) intake to 2,300 mg a day. If you have hypertension, you may need to reduce your sodium intake to 1,500 mg a day.  When on the DASH eating plan, aim to eat more fresh fruits and vegetables, whole grains, lean proteins, low-fat dairy, and heart-healthy fats.  Work with your health care provider or diet and nutrition specialist (dietitian) to adjust your eating plan to your individual calorie needs. This information is not intended to replace advice given to you by your health care provider. Make sure you discuss any questions you have with your health care provider. Document Revised: 04/19/2017 Document Reviewed: 04/30/2016 Elsevier Patient Education  2020 [employer].
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
## 2020-05-09 ENCOUNTER — Emergency Department (HOSPITAL_COMMUNITY): Payer: 59

## 2020-05-09 ENCOUNTER — Encounter (HOSPITAL_COMMUNITY): Admission: EM | Disposition: A | Discharge status: Home / Self Care | Payer: Self-pay | Source: Home / Self Care

## 2020-05-09 ENCOUNTER — Ambulatory Visit (HOSPITAL_COMMUNITY): Admit: 2020-05-09 | Payer: 59 | Admitting: Cardiovascular Disease

## 2020-05-09 ENCOUNTER — Inpatient Hospital Stay (HOSPITAL_COMMUNITY)
Admission: EM | Admit: 2020-05-09 | Discharge: 2020-05-11 | DRG: 314 | Disposition: A | Discharge status: Home / Self Care | Payer: 59 | Attending: Internal Medicine | Admitting: Internal Medicine

## 2020-05-09 ENCOUNTER — Other Ambulatory Visit: Payer: Self-pay

## 2020-05-09 ENCOUNTER — Emergency Department (HOSPITAL_BASED_OUTPATIENT_CLINIC_OR_DEPARTMENT_OTHER): Payer: 59

## 2020-05-09 DIAGNOSIS — F17290 Nicotine dependence, other tobacco product, uncomplicated: Secondary | ICD-10-CM | POA: Diagnosis present

## 2020-05-09 DIAGNOSIS — R079 Chest pain, unspecified: Secondary | ICD-10-CM

## 2020-05-09 DIAGNOSIS — F1290 Cannabis use, unspecified, uncomplicated: Secondary | ICD-10-CM | POA: Diagnosis present

## 2020-05-09 DIAGNOSIS — R0789 Other chest pain: Secondary | ICD-10-CM | POA: Diagnosis not present

## 2020-05-09 DIAGNOSIS — Z8249 Family history of ischemic heart disease and other diseases of the circulatory system: Secondary | ICD-10-CM

## 2020-05-09 DIAGNOSIS — I319 Disease of pericardium, unspecified: Secondary | ICD-10-CM

## 2020-05-09 DIAGNOSIS — I309 Acute pericarditis, unspecified: Secondary | ICD-10-CM | POA: Diagnosis not present

## 2020-05-09 DIAGNOSIS — R7989 Other specified abnormal findings of blood chemistry: Secondary | ICD-10-CM

## 2020-05-09 DIAGNOSIS — R7303 Prediabetes: Secondary | ICD-10-CM | POA: Diagnosis present

## 2020-05-09 DIAGNOSIS — Q245 Malformation of coronary vessels: Secondary | ICD-10-CM

## 2020-05-09 DIAGNOSIS — R739 Hyperglycemia, unspecified: Secondary | ICD-10-CM | POA: Diagnosis present

## 2020-05-09 DIAGNOSIS — I5031 Acute diastolic (congestive) heart failure: Secondary | ICD-10-CM | POA: Diagnosis present

## 2020-05-09 DIAGNOSIS — M7918 Myalgia, other site: Secondary | ICD-10-CM | POA: Diagnosis present

## 2020-05-09 DIAGNOSIS — I110 Hypertensive heart disease with heart failure: Secondary | ICD-10-CM | POA: Diagnosis present

## 2020-05-09 DIAGNOSIS — R778 Other specified abnormalities of plasma proteins: Secondary | ICD-10-CM

## 2020-05-09 DIAGNOSIS — E785 Hyperlipidemia, unspecified: Secondary | ICD-10-CM | POA: Diagnosis present

## 2020-05-09 DIAGNOSIS — Z20822 Contact with and (suspected) exposure to covid-19: Secondary | ICD-10-CM | POA: Diagnosis present

## 2020-05-09 DIAGNOSIS — I161 Hypertensive emergency: Secondary | ICD-10-CM | POA: Diagnosis present

## 2020-05-09 HISTORY — DX: Disease of pericardium, unspecified: I31.9

## 2020-05-09 LAB — CBC WITH DIFFERENTIAL/PLATELET
Abs Immature Granulocytes: 0.01 10*3/uL (ref 0.00–0.07)
Basophils Absolute: 0 10*3/uL (ref 0.0–0.1)
Basophils Relative: 1 %
Eosinophils Absolute: 0.1 10*3/uL (ref 0.0–0.5)
Eosinophils Relative: 1 %
HCT: 42.6 % (ref 39.0–52.0)
Hemoglobin: 14.8 g/dL (ref 13.0–17.0)
Immature Granulocytes: 0 %
Lymphocytes Relative: 35 %
Lymphs Abs: 2 10*3/uL (ref 0.7–4.0)
MCH: 30.2 pg (ref 26.0–34.0)
MCHC: 34.7 g/dL (ref 30.0–36.0)
MCV: 86.9 fL (ref 80.0–100.0)
Monocytes Absolute: 0.6 10*3/uL (ref 0.1–1.0)
Monocytes Relative: 11 %
Neutro Abs: 2.9 10*3/uL (ref 1.7–7.7)
Neutrophils Relative %: 52 %
Platelets: 187 10*3/uL (ref 150–400)
RBC: 4.9 MIL/uL (ref 4.22–5.81)
RDW: 13.6 % (ref 11.5–15.5)
WBC: 5.6 10*3/uL (ref 4.0–10.5)
nRBC: 0 % (ref 0.0–0.2)

## 2020-05-09 LAB — ECHOCARDIOGRAM COMPLETE
Area-P 1/2: 2.73 cm2
Height: 71 in
S' Lateral: 3.4 cm
Weight: 3520 oz

## 2020-05-09 LAB — HEMOGLOBIN A1C
Hgb A1c MFr Bld: 5.5 % (ref 4.8–5.6)
Mean Plasma Glucose: 111.15 mg/dL

## 2020-05-09 LAB — BASIC METABOLIC PANEL
Anion gap: 13 (ref 5–15)
BUN: 11 mg/dL (ref 6–20)
CO2: 25 mmol/L (ref 22–32)
Calcium: 9.1 mg/dL (ref 8.9–10.3)
Chloride: 99 mmol/L (ref 98–111)
Creatinine, Ser: 1.04 mg/dL (ref 0.61–1.24)
GFR, Estimated: >60 mL/min (ref >60)
Glucose, Bld: 104 mg/dL — ABNORMAL HIGH (ref 70–99)
Potassium: 3.6 mmol/L (ref 3.5–5.1)
Sodium: 137 mmol/L (ref 135–145)

## 2020-05-09 LAB — C-REACTIVE PROTEIN: CRP: 4.5 mg/dL — ABNORMAL HIGH (ref <1.0)

## 2020-05-09 LAB — TROPONIN I (HIGH SENSITIVITY)
Troponin I (High Sensitivity): 53 ng/L — ABNORMAL HIGH (ref <18)
Troponin I (High Sensitivity): 34 ng/L — ABNORMAL HIGH (ref <18)

## 2020-05-09 LAB — RESP PANEL BY RT-PCR (FLU A&B, COVID) ARPGX2
Influenza A by PCR: NEGATIVE
Influenza B by PCR: NEGATIVE
SARS Coronavirus 2 by RT PCR: NEGATIVE

## 2020-05-09 LAB — SEDIMENTATION RATE: Sed Rate: 2 mm/hr (ref 0–16)

## 2020-05-09 LAB — HIV ANTIBODY (ROUTINE TESTING W REFLEX): HIV Screen 4th Generation wRfx: NONREACTIVE

## 2020-05-09 SURGERY — CORONARY/GRAFT ACUTE MI REVASCULARIZATION
Anesthesia: LOCAL

## 2020-05-09 MED ORDER — SODIUM CHLORIDE 0.9% FLUSH
3.0000 mL | Freq: Two times a day (BID) | INTRAVENOUS | Status: DC
  Administered 2020-05-09 – 2020-05-10 (×2): 3 mL via INTRAVENOUS

## 2020-05-09 MED ORDER — ENOXAPARIN SODIUM 40 MG/0.4ML ~~LOC~~ SOLN
40.0000 mg | SUBCUTANEOUS | Status: DC
  Administered 2020-05-09: 40 mg via SUBCUTANEOUS
  Filled 2020-05-09: qty 0.4

## 2020-05-09 MED ORDER — ACETAMINOPHEN 650 MG RE SUPP: 650.0000 mg | Freq: Four times a day (QID) | RECTAL | Status: DC | PRN

## 2020-05-09 MED ORDER — CARVEDILOL 3.125 MG PO TABS
6.2500 mg | ORAL_TABLET | Freq: Once | ORAL | Status: AC
  Administered 2020-05-09: 18:00:00 6.25 mg via ORAL
  Filled 2020-05-09: qty 2

## 2020-05-09 MED ORDER — ACETAMINOPHEN 325 MG PO TABS
650.0000 mg | ORAL_TABLET | Freq: Four times a day (QID) | ORAL | Status: DC | PRN
  Administered 2020-05-10: 650 mg via ORAL
  Filled 2020-05-09: qty 2

## 2020-05-09 MED ORDER — POLYETHYLENE GLYCOL 3350 17 G PO PACK
17.0000 g | PACK | Freq: Every day | ORAL | Status: DC | PRN
  Filled 2020-05-09: qty 1

## 2020-05-09 MED ORDER — AMLODIPINE BESYLATE 5 MG PO TABS
5.0000 mg | ORAL_TABLET | Freq: Every day | ORAL | Status: DC
  Administered 2020-05-09 – 2020-05-10 (×2): 5 mg via ORAL
  Filled 2020-05-09 (×2): qty 1

## 2020-05-09 MED ORDER — KETOROLAC TROMETHAMINE 15 MG/ML IJ SOLN
15.0000 mg | Freq: Once | INTRAMUSCULAR | Status: AC
  Administered 2020-05-09: 15 mg via INTRAVENOUS
  Filled 2020-05-09: qty 1

## 2020-05-09 MED ORDER — COLCHICINE 0.6 MG PO TABS
0.6000 mg | ORAL_TABLET | Freq: Two times a day (BID) | ORAL | Status: DC
  Administered 2020-05-09 – 2020-05-10 (×3): 0.6 mg via ORAL
  Filled 2020-05-09 (×4): qty 1

## 2020-05-09 MED ORDER — IBUPROFEN 800 MG PO TABS
800.0000 mg | ORAL_TABLET | Freq: Three times a day (TID) | ORAL | Status: DC
  Administered 2020-05-09 – 2020-05-10 (×5): 800 mg via ORAL
  Filled 2020-05-09 (×6): qty 1

## 2020-05-09 MED ORDER — ADULT MULTIVITAMIN W/MINERALS CH
1.0000 | ORAL_TABLET | Freq: Every day | ORAL | Status: DC
  Administered 2020-05-09 – 2020-05-10 (×2): 1 via ORAL
  Filled 2020-05-09 (×2): qty 1

## 2020-05-09 MED ORDER — CARVEDILOL 12.5 MG PO TABS
12.5000 mg | ORAL_TABLET | Freq: Two times a day (BID) | ORAL | Status: DC
  Administered 2020-05-10: 18:00:00 12.5 mg via ORAL
  Filled 2020-05-09: qty 1

## 2020-05-09 NOTE — Progress Notes (Signed)
  Echocardiogram 2D Echocardiogram has been performed.  Kevin Combs 05/09/2020, 12:17 PM

## 2020-05-09 NOTE — Progress Notes (Signed)
Interventional cardiology note: 44 year old male who presented to urgent care today with dull left shoulder and left upper arm pain with a pleuritic component since Saturday. He has taken multiple medicines to try to help with his pain. He came to urgent care this morning because his symptoms have persisted now for 2 days. He denies any substernal chest pressure. Initially, a code STEMI is paged because of diffuse ST segment elevation. I evaluated the patient as soon as he presented to the emergency department. He is very comfortable with no significant chest pain at present. His EKG shows diffuse ST segment elevation and PR depression. High suspicion that he may have pericarditis. I have canceled the code STEMI and have requested emergency room evaluation. A stat echocardiogram will be ordered to assess for any regional wall motion abnormalities or pericardial effusion. Discussed with emergency department physician Dr. Audley Hose.  Tonny Bollman 05/09/2020 11:28 AM

## 2020-05-09 NOTE — ED Notes (Signed)
Pt off floor to Xray.

## 2020-05-09 NOTE — ED Triage Notes (Addendum)
Pt reports on Saturday left arm pain that has progress to dull left sided chest pain chest pain that is intermittent. Pt reports chest discomfort when taking deep breaths.   Pt was seen at urgent care and was transported here by EMS. EMS activated as CODE STEMI. Cardiology met pt at bridge-- cleared to be roomed in ED for further workup.  EMS administered 324 asa and 1 SL nitro

## 2020-05-09 NOTE — Consult Note (Addendum)
Cardiology Consultation:   Patient ID: Kevin Combs MRN: [id number]; DOB: 04/26/1976  Admit date: 05/09/2020 Date of Consult: 05/09/2020  Primary Care Provider: Patient, No Pcp Per [facility] Cardiologist: New (Dr. Jacques Navy) [facility] Electrophysiologist:  None    Patient Profile:   Kevin Combs is a 44 y.o. male with no known significant past medical history who is being seen today for the evaluation of chest pain at the request of Dr. Laddie Aquas.  History of Present Illness:   Kevin Combs is a 44 year old male with no significant past medical history. No known cardiac history. He has never seen a Cardiologist or had any type of cardiac work-up. He states his BP has previously been high at Urgent Care visits but is always OK at PCP's office. Urgent Care has started him on BP medications before but PCP has discontinued it. No known hyperlipidemia or diabetes. Patient has polysubstance abuse - he smokes 2 cigars per day, smokes 2 "joints" (marijuana) per day, and drinks 1/2-1 pint of vodka per day. No history of alcohol withdrawal or DTs. Last drink was Saturday. No cocaine use. No known family history of cardiovascular disease.   Patient presented to Urgent Care today for further evaluation of left arm pain. EKG there showed diffuse ST elevations; therefore, code STEMI was called and patient was transferred to [facility] ED. STEMI MD (Dr. Excell Seltzer) saw patient in the ED. Patient was in no significant pain at the time and EKG showed diffuse ST elevation and PR depression concerning for pericarditis. Code STEMI was cancelled and work-up was continued in the ED. High-sensitivity troponin mildly elevated and already downtrending (53 >> 34). Chest x-ray showed no acute findings. WBC 5.6, Hgb 14.8, Plts 187. Na 137, K 3.6, Glucose 104, BUN 11, Cr 1.04. Respiratory panel negative for COVID and influenza A/B. Internal Medicine Residency was asked to admit and Cardiology was consulted for further  evaluation.  At the time of this evaluation, patient is sitting up in bed and does not look like he is in any distress. Patient report intermittent left neck/shoulder pain that started on Saturday. Pain extends from neck to mid upper arm and patient describes this as an ache. Patient then reports dullness in lower arm. He is wondering if this could be due to "pinched nerve." He also reports chest pain when he takes a deep breath. He has difficulty describing this pain. He ranks both his arm pain and chest pain as a 5/10. Not worse with exertion. Worse when laying down. Palpation of left neck/shoulder helps the pain. He notes some diaphoresis with his alcohol use but not necessarily associated with his pain. No associated shortness of breath, nausea, vomiting. No palpitations, lightheadedness, dizziness, syncope, orthopnea, PND, edema. No recent fevers or illness. No abnormal bleeding in urine or stools.    No past medical history on file.  Past Surgical History:  Procedure Laterality Date  . KNEE SURGERY Right 1992  . LAPAROSCOPIC APPENDECTOMY N/A 01/28/2015   Procedure: APPENDECTOMY LAPAROSCOPIC;  Surgeon: Claud Kelp, MD;  Location: [facility];  Service: General;  Laterality: N/A;     Home Medications:  Prior to Admission medications   Medication Sig Start Date End Date Taking? Authorizing Provider  acetaminophen (TYLENOL) 500 MG tablet Take 1,000 mg by mouth every 6 (six) hours as needed (for pain).   Yes [provider]    Inpatient Medications: Scheduled Meds:  Continuous Infusions:  PRN Meds:   Allergies:   No Known Allergies  Social History:   Social History   Socioeconomic History  . Marital status: Single    Spouse name: Not on file  . Number of children: Not on file  . Years of education: Not on file  . Highest education level: Not on file  Occupational History  . Not on file  Tobacco Use  . Smoking status: Former Smoker    Types: Cigars  . Smokeless  tobacco: Never Used  Vaping Use  . Vaping Use: Never used  Substance and Sexual Activity  . Alcohol use: Yes    Comment: social  . Drug use: Yes    Types: Marijuana  . Sexual activity: Not on file  Other Topics Concern  . Not on file  Social History Narrative  . Not on file   Social Determinants of Health   Financial Resource Strain: Not on file  Food Insecurity: Not on file  Transportation Needs: Not on file  Physical Activity: Not on file  Stress: Not on file  Social Connections: Not on file  Intimate Partner Violence: Not on file    Family History:    Family History  Problem Relation Age of Onset  . Hypertension Mother   . Crohn's disease Daughter      ROS:  Please see the history of present illness.  All other ROS reviewed and negative.     Physical Exam/Data:   Vitals:   05/09/20 1415 05/09/20 1507 05/09/20 1530 05/09/20 1630  BP: (!) 161/106 (!) 173/116 (!) 171/122 (!) 169/116  Pulse: 92 87 89 80  Resp: (!) 26 18 (!) 22 (!) 24  Temp:      TempSrc:      SpO2: 96% 99% 100% 98%  Weight:      Height:       No intake or output data in the 24 hours ending 05/09/20 1719 Last 3 Weights 05/09/2020 12/22/2019 12/08/2017  Weight (lbs) 220 lb 218 lb 223 lb 9.6 oz  Weight (kg) 99.791 kg 98.884 kg 101.424 kg     Body mass index is 30.68 kg/m.  General: 44 y.o. male resting comfortably in no acute distress. HEENT: Normocephalic and atraumatic. Sclera clear. Neck: Supple. No carotid bruits. No JVD. Heart: RRR. Distinct S1 and S2. No murmurs, gallops, or rubs. Radial and distal pedal pulses 2+ and equal bilaterally. Lungs: No increased work of breathing. Clear to ausculation bilaterally. No wheezes, rhonchi, or rales.  Abdomen: Soft, non-distended, and non-tender to palpation. Bowel sounds present. MSK: Normal strength and tone for age. Extremities: No lower extremity edema.    Skin: Warm and dry. Neuro: Alert and oriented x3. No focal deficits. Psych: Normal  affect. Responds appropriately.   EKG:  The EKG was personally reviewed and demonstrates:  Normal sinus rhythm, rate 88 bpm, with probable left atrial enlargement and diffuse ST elevations and PR depressions.   Telemetry:  Telemetry was personally reviewed and demonstrates:  Normal sinus rhythm with rates in the 80's to 90's.   Relevant CV Studies:  Echocardiogram 05/09/2020: Impressions: 1. Left ventricular ejection fraction, by estimation, is 55 to 60%. The  left ventricle has normal function. The left ventricle has no regional  wall motion abnormalities. There is mild left ventricular hypertrophy.  Left ventricular diastolic parameters  are consistent with Grade I diastolic dysfunction (impaired relaxation).  2. Right ventricular systolic function is normal. The right ventricular  size is normal. Tricuspid regurgitation signal is inadequate for assessing  PA pressure.  3. The mitral valve is abnormal.  Trivial mitral valve regurgitation.  4. The aortic valve is tricuspid. Aortic valve regurgitation is not  visualized.  5. The inferior vena cava is normal in size with greater than 50%  respiratory variability, suggesting right atrial pressure of 3 mmHg.   Comparison(s): No prior Echocardiogram.  Laboratory Data:  High Sensitivity Troponin:   Recent Labs  Lab 05/09/20 1128 05/09/20 1322  TROPONINIHS 53* 34*     Chemistry Recent Labs  Lab 05/09/20 1128  NA 137  K 3.6  CL 99  CO2 25  GLUCOSE 104*  BUN 11  CREATININE 1.04  CALCIUM 9.1  GFRNONAA >60  ANIONGAP 13    No results for input(s): PROT, ALBUMIN, AST, ALT, ALKPHOS, BILITOT in the last 168 hours. Hematology Recent Labs  Lab 05/09/20 1128  WBC 5.6  RBC 4.90  HGB 14.8  HCT 42.6  MCV 86.9  MCH 30.2  MCHC 34.7  RDW 13.6  PLT 187   BNPNo results for input(s): BNP, PROBNP in the last 168 hours.  DDimer No results for input(s): DDIMER in the last 168 hours.   Radiology/Studies:  DG Chest 2  View  Result Date: 05/09/2020 CLINICAL DATA:  Left arm pain which began 05/07/2020 has migrated into the chest. EXAM: CHEST - 2 VIEW COMPARISON:  PA and lateral chest 02/02/2013. FINDINGS: Lungs clear. Heart size normal. No pneumothorax or pleural fluid. No bony abnormality. IMPRESSION: Normal chest. Electronically Signed   By: Drusilla Kannerhomas  Dalessio M.D.   On: 05/09/2020 11:44   ECHOCARDIOGRAM COMPLETE  Result Date: 05/09/2020    ECHOCARDIOGRAM REPORT   Patient Name:   Kevin Combs Date of Exam: 05/09/2020 Medical Rec #:  [id number]     Height:       71.0 in Accession #:    [id number]    Weight:       220.0 lb Date of Birth:  [birth year]     BSA:          2.196 m Patient Age:    44 years      BP:           143/102 mmHg Patient Gender: M             HR:           88 bpm. Exam Location:  Inpatient Procedure: 2D Echo, Cardiac Doppler and Color Doppler                       STAT ECHO Reported to: Dr Italyhad Hilty on 05/09/2020 12:16:00 PM. Indications:    STEMI I21.3  History:        Patient has no prior history of Echocardiogram examinations.  Sonographer:    Elmarie Shileyiffany Dance Referring Phys: [id number] MICHAEL COOPER IMPRESSIONS  1. Left ventricular ejection fraction, by estimation, is 55 to 60%. The left ventricle has normal function. The left ventricle has no regional wall motion abnormalities. There is mild left ventricular hypertrophy. Left ventricular diastolic parameters are consistent with Grade I diastolic dysfunction (impaired relaxation).  2. Right ventricular systolic function is normal. The right ventricular size is normal. Tricuspid regurgitation signal is inadequate for assessing PA pressure.  3. The mitral valve is abnormal. Trivial mitral valve regurgitation.  4. The aortic valve is tricuspid. Aortic valve regurgitation is not visualized.  5. The inferior vena cava is normal in size with greater than 50% respiratory variability, suggesting right atrial pressure of 3 mmHg. Comparison(s): No prior Echocardiogram.  Conclusion(s)/Recommendation(s): Critical findings reported to  Dr. Excell Seltzer and acknowledged at 12:24 pm on 05/09/20. FINDINGS  Left Ventricle: Left ventricular ejection fraction, by estimation, is 55 to 60%. The left ventricle has normal function. The left ventricle has no regional wall motion abnormalities. The left ventricular internal cavity size was normal in size. There is  mild left ventricular hypertrophy. Left ventricular diastolic parameters are consistent with Grade I diastolic dysfunction (impaired relaxation). Normal left ventricular filling pressure. Right Ventricle: The right ventricular size is normal. No increase in right ventricular wall thickness. Right ventricular systolic function is normal. Tricuspid regurgitation signal is inadequate for assessing PA pressure. Left Atrium: Left atrial size was normal in size. Right Atrium: Right atrial size was normal in size. Pericardium: There is no evidence of pericardial effusion. Mitral Valve: The mitral valve is abnormal. There is mild thickening of the mitral valve leaflet(s). Trivial mitral valve regurgitation. Tricuspid Valve: The tricuspid valve is grossly normal. Tricuspid valve regurgitation is trivial. Aortic Valve: The aortic valve is tricuspid. Aortic valve regurgitation is not visualized. Pulmonic Valve: The pulmonic valve was normal in structure. Pulmonic valve regurgitation is mild. Aorta: The aortic root and ascending aorta are structurally normal, with no evidence of dilitation. Venous: The inferior vena cava is normal in size with greater than 50% respiratory variability, suggesting right atrial pressure of 3 mmHg. IAS/Shunts: There is right bowing of the interatrial septum, suggestive of elevated left atrial pressure. No atrial level shunt detected by color flow Doppler.  LEFT VENTRICLE PLAX 2D LVIDd:         4.60 cm  Diastology LVIDs:         3.40 cm  LV e' medial:    8.38 cm/s LV PW:         1.30 cm  LV E/e' medial:  6.6 LV IVS:         1.30 cm  LV e' lateral:   10.10 cm/s LVOT diam:     2.50 cm  LV E/e' lateral: 5.5 LV SV:         90 LV SV Index:   41 LVOT Area:     4.91 cm  RIGHT VENTRICLE             IVC RV Basal diam:  3.30 cm     IVC diam: 1.70 cm RV Mid diam:    2.70 cm RV S prime:     12.10 cm/s TAPSE (M-mode): 1.6 cm LEFT ATRIUM             Index       RIGHT ATRIUM           Index LA diam:        4.30 cm 1.96 cm/m  RA Area:     14.20 cm LA Vol (A2C):   57.9 ml 26.37 ml/m RA Volume:   31.80 ml  14.48 ml/m LA Vol (A4C):   42.4 ml 19.31 ml/m LA Biplane Vol: 50.2 ml 22.86 ml/m  AORTIC VALVE LVOT Vmax:   101.00 cm/s LVOT Vmean:  62.500 cm/s LVOT VTI:    0.184 m  AORTA Ao Root diam: 3.50 cm Ao Asc diam:  3.70 cm MITRAL VALVE MV Area (PHT): 2.73 cm    SHUNTS MV Decel Time: 278 msec    Systemic VTI:  0.18 m MV E velocity: 55.30 cm/s  Systemic Diam: 2.50 cm MV A velocity: 66.00 cm/s MV E/A ratio:  0.84 Zoila Shutter MD Electronically signed by Zoila Shutter MD Signature Date/Time: 05/09/2020/12:26:30 PM    Final  Assessment and Plan:   Suspect Pericarditis - Patient transferred from Urgent Care as Code STEMI after presenting with left arm pain x2 days and pleuritic chest pain.  - EKG showed diffuse ST elevations and PR depressions consistent with pericarditis. Code STEMI cancelled.  - High-sensitivity troponin mildly elevated and down-trending at 53 >> 34. Not consistent with ACS. Possibly secondary to myopericarditis or demand ischemia from hypertensive urgency.  - Echo showed LVEF of 55-60% with normal wall motion, mild LVH, and grade 1 diastolic dysfunction.  - Patient continues to have 5/10 arm pain at this time. Toradol has been ordered.  - Will check fasting lipid panel and LFTs.  - Will check Sed Rate and CRP.  - Will go ahead and treat for pericarditis - will start Ibuprofen 800mg  three times daily and Colchicine 0.6mg  twice daily.  - Will also plan for coronary CTA tomorrow to rule out significant CAD.  - Arm  pain may be musculoskeletal.   Hypertensive Urgency  - BP as high as the 180's/110's in the ED. Patient has no formal diagnosis of hypertension. He states BP has been elevated at Urgent Cares in the past who have started BP medications but these were discontinued at follow-up with PCP.  - Will start Amlodipine 5mg  daily.  - Will start Coreg 6.5mg  tonight and then 12.5mg  twice daily starting tomorrow.  Polysubstance Abuse - Patient reports he smokes 2 cigars per day, smokes 2 joints of marijuana per day, and drinks 1/2-1 pint of vodka per day.  - No history of alcohol withdrawal or DTs. Last drink was Saturday.  - No cocaine use. - Will order urine drug screen. - Will need to discuss importance of complete cessation.   TIMI Risk Score for Unstable Angina or Non-ST Elevation MI:   The patient's TIMI risk score is 2, which indicates a 8% risk of all cause mortality, new or recurrent myocardial infarction or need for urgent revascularization in the next 14 days.  For questions or updates, please contact [facility] Please consult [email or web address] for contact info under    Signed, , PA-C  05/09/2020 5:19 PM  Patient seen and examined with Corrin Parker PA-C.  Agree as above, with the following exceptions and changes as noted below. Kevin Combs is a 44 yo male with chest pain in setting of hypertension, shoulder pain, and evidence of ECG of pericarditis. Describes pleutitic chest pain.  Gen: NAD, CV: RRR, no murmurs, no rub Lungs: clear, Abd: soft, Extrem: Warm, well perfused, no edema, Neuro/Psych: alert and oriented x 3, normal mood and affect. All available labs, radiology testing, previous records reviewed.   ECG suggests pericarditis. Sed rate is 2 and CRP is 4.5, only mildly elevated. With chest pain and ECG abnormality, would recommend CCTA which we can perform tomorrow. He had a normal echo today, no pericardial effusion. Recommend starting treatment for pericarditis,  ibuprofen 800 mg TID for 1 mo and colchicine 0.6 mg BID for 3 mo. Can also consider cardiac MRI to better define pericarditis, will determine if needed after CCTA.   Cliffton Asters, MD

## 2020-05-09 NOTE — H&P (Signed)
Date: 05/09/2020               Patient Name:  Kevin Combs MRN: [id number]  DOB: 27-Oct-1975 Age / Sex: 44 y.o., male   PCP: Patient, No Pcp Per         Medical Service: Internal Medicine Teaching Service         Attending Physician: Dr. Velna Ochs    First Contact: Dr. Konrad Penta Pager: [phone]  Second Contact: Dr. Charleen Kirks Pager: [phone]       After Hours (After 5p/  First Contact Pager: [phone]  weekends / holidays): Second Contact Pager: [phone]   Chief Complaint: Chest pain  History of Present Illness:   Kevin Combs is a 44 y.o. gentleman w/ PMHx polysubstance use with no other known PMHx who presented to the ED with 2 days of pleuritic CP. He first went to an Urgent Care for further work up of left arm pain, was found to have diffuse ST segment elevations, STEMI was called, and he was transferred to [facility]. Here, he states that his chest pain is more of a moderate discomfort, located in his left chest, worsened with breathing and unaffected by exertion, movement, or palpation. He describes his left arm pain as a tingling that starts at the base of his left neck and runs down into his left fingers that started at about the same time. He has had muscle spasms of his left chest that started during our examination. He denies any right-sided symptoms, SOB, fevers, chills, new joint pain, nausea, vomiting, diarrhea, or any other complaints other than a mild headache. He is concerned by how high his blood pressure is. He says he has been prescribed antihypertensives at urgent cares in the past, but had been told to stop taking these by his PCP. Denies hx of DM or HLD, cardiac, renal, lung, autoimmune disease, gout, exposure to tics, or any other medical history. Denies sick contacts or trauma other than lifting a heavy grandchild recently.   Home Medications: None   Allergies: Allergies as of 05/09/2020  . (No Known Allergies)   Surgical Hx: Positive for appendectomy, bilateral  knee surgeries.   Family History:  Negative for cardiac disease.  Negative for lung disease.  Negative for kidney disease.   Social History:  Patient lives at home with his wife and child.  Patient smokes cigars and marijuana daily.  Drinks 1 pint of beer daily.  Denies any other illicit drug use.   Review of Systems: A complete ROS was negative except as per HPI.   Physical Exam: Blood pressure (!) 169/116, pulse 80, temperature 99.2 F (37.3 C), temperature source Oral, resp. rate (!) 24, height 5' 11"  (1.803 m), weight 99.8 kg, SpO2 98 %.  General: Patient appears well. Resting comfortably in no acute distress. Eyes: Sclera non-icteric. No conjunctival injection.  HENT: Neck is supple. No nasal discharge. Respiratory: Lungs are CTA, bilaterally. No wheezes, rales, or rhonchi.  Cardiovascular: Regular rate and rhythm. No murmurs, rubs, or gallops. No lower extremity edema. Musculoskeletal: There are irregular spasms of the musculature of the left chest wall. ROMI in the neck and LUE without TTP.  Neurological: Patient is awake, alert, and oriented x 3.  Abdominal: Soft and non-tender to palpation. Bowel sounds intact. No rebound or guarding. Skin: No lesions. No rashes.  Psych: Normal affect. Normal tone of voice.   EKG: personally reviewed my interpretation is NSR at 88bpm. There are diffuse ST segment elevations, sloping PR  depressions, and probable left atrial enlargement.   CXR: personally reviewed my interpretation is unremarkable CXR without acute cardiopulmonary disease.   Assessment & Plan by Problem: Active Problems:   Acute pericarditis  # Acute Pericarditis  Patient presented with 2 days of pleuritic L-sided CP. He received 372m ASA and 0.460mNTG in the ED. RT-PCR negative. Troponins 53 > 34 . EKG showed diffuse ST segment elevations and PR depressions. CXR unremarkable. ECHO showed no regional wall motion abnormalities or pericardial effusion.   - Ibuprofen  80052mhree times daily  - Colchicine 0.6mg60mice daily  - Continue Tylenol 650mg23mhours PRN for pain / fever - Check CRP, ESR   # New-Onset HFpEF  ECHO showed mild LVH with Grade I Diastolic Dysfunction, EF 55-6044-31% trivial mitral valve regurgitation. No priors for comparison. Patient denies LE swelling, orthopnea, weight gain. Likely in the setting of uncontrolled hypertension.   - Check morning BMP, CBC  - Check lipid panel  - Check coronary CT   # Hypertension Patient's blood pressure has remained significantly elevated in the hospital. Patient is not on any antihypertensives at home and notes his blood pressure is usually < 150 s540olic outpatient.   - Start amlodipine 5mg d49my  - Start carvedilol 12.5mg tw50m daily with meals  - Continue to monitor   # Hyperglycemia  Blood sugar just mildly elevated to 104 on admission.   - Check Hb A1c   # Polysubstance Abuse  Patient smokes cigars and marijuana and drinks alcohol daily. Denies history of withdrawal.   - Daily Multivitamin w/ Minerals  - Check UDS   Code Status: Full Code DVT PPx: Lovenox  Diet: Regular  IVF: None   Dispo: Admit patient to Observation with expected length of stay less than 2 midnights.  Signed: BasarabJose Persia/20/2021, 5:07 PM  Pager: [phone] on weekdays and 1pm on weekends: On Call pager: [phone]

## 2020-05-09 NOTE — ED Provider Notes (Signed)
[facility]ENC[facility] Provider Note   CSN: [id number] Arrival date & time: 05/09/20  1114     History Chief Complaint  Patient presents with  . Chest Pain    Manus RuddJames A Chronis is a 44 y.o. male.  Patient presents to the ER for chest pain.  He was seen in urgent care prior to arrival he was given aspirin 324 mg orally.  Upon arrival here EKG was concerning for STEMI and a code STEMI was activated.  He was seen by the STEMI cardiologist given his pain was gone along with his diagnostic testing here STEMI code was canceled and medical work-up was recommended.  Patient states he no longer has any chest pain but he had earlier last night and this morning.  He does have some chest wheezing deep however.  Otherwise denies any fevers or cough.  No vomiting or diarrhea.  No chest pain currently at rest.          No past medical history on file.  Patient Active Problem List   Diagnosis Date Noted  . Acute appendicitis 01/28/2015  . Acute appendicitis with localized peritonitis 01/28/2015    Past Surgical History:  Procedure Laterality Date  . KNEE SURGERY Right 1992  . LAPAROSCOPIC APPENDECTOMY N/A 01/28/2015   Procedure: APPENDECTOMY LAPAROSCOPIC;  Surgeon: Claud KelpHaywood Ingram, MD;  Location: [facility];  Service: General;  Laterality: N/A;       Family History  Problem Relation Age of Onset  . Hypertension Mother   . Crohn's disease Daughter     Social History   Tobacco Use  . Smoking status: Former Smoker    Types: Cigars  . Smokeless tobacco: Never Used  Vaping Use  . Vaping Use: Never used  Substance Use Topics  . Alcohol use: Yes    Comment: social  . Drug use: Yes    Types: Marijuana    Home Medications Prior to Admission medications   Not on File    Allergies    Patient has no known allergies.  Review of Systems   Review of Systems  Constitutional: Negative for fever.  HENT: Negative for ear pain and sore throat.   Eyes:  Negative for pain.  Respiratory: Negative for cough.   Cardiovascular: Positive for chest pain.  Gastrointestinal: Negative for abdominal pain.  Genitourinary: Negative for flank pain.  Musculoskeletal: Negative for back pain.  Skin: Negative for color change and rash.  Neurological: Negative for syncope.  All other systems reviewed and are negative.   Physical Exam Updated Vital Signs BP (!) 143/102   Pulse 82   Temp 99.2 F (37.3 C) (Oral)   Resp 16   Ht 5\' 11"  (1.803 m)   Wt 99.8 kg   SpO2 100%   BMI 30.68 kg/m   Physical Exam Constitutional:      General: He is not in acute distress.    Appearance: He is well-developed.  HENT:     Head: Normocephalic.     Mouth/Throat:     Mouth: Mucous membranes are moist.  Cardiovascular:     Rate and Rhythm: Normal rate.  Pulmonary:     Effort: Pulmonary effort is normal.  Abdominal:     Palpations: Abdomen is soft.  Musculoskeletal:     Right lower leg: No edema.     Left lower leg: No edema.  Skin:    General: Skin is warm.     Capillary Refill: Capillary refill takes less than 2 seconds.  Neurological:     General: No focal deficit present.     Mental Status: He is alert.     ED Results / Procedures / Treatments   Labs (all labs ordered are listed, but only abnormal results are displayed) Labs Reviewed  BASIC METABOLIC PANEL - Abnormal; Notable for the following components:      Result Value   Glucose, Bld 104 (*)    All other components within normal limits  TROPONIN I (HIGH SENSITIVITY) - Abnormal; Notable for the following components:   Troponin I (High Sensitivity) 53 (*)    All other components within normal limits  RESP PANEL BY RT-PCR (FLU A&B, COVID) ARPGX2  CBC WITH DIFFERENTIAL/PLATELET  TROPONIN I (HIGH SENSITIVITY)    EKG None  Radiology DG Chest 2 View  Result Date: 05/09/2020 CLINICAL DATA:  Left arm pain which began 05/07/2020 has migrated into the chest. EXAM: CHEST - 2 VIEW  COMPARISON:  PA and lateral chest 02/02/2013. FINDINGS: Lungs clear. Heart size normal. No pneumothorax or pleural fluid. No bony abnormality. IMPRESSION: Normal chest. Electronically Signed   By: Drusilla Kanner M.D.   On: 05/09/2020 11:44   ECHOCARDIOGRAM COMPLETE  Result Date: 05/09/2020    ECHOCARDIOGRAM REPORT   Patient Name:   BARTOW ZYLSTRA Southers Date of Exam: 05/09/2020 Medical Rec #:  [id number]     Height:       71.0 in Accession #:    [id number]    Weight:       220.0 lb Date of Birth:  04-Mar-1976     BSA:          2.196 m Patient Age:    44 years      BP:           143/102 mmHg Patient Gender: M             HR:           88 bpm. Exam Location:  Inpatient Procedure: 2D Echo, Cardiac Doppler and Color Doppler                       STAT ECHO Reported to: Dr Italy Hilty on 05/09/2020 12:16:00 PM. Indications:    STEMI I21.3  History:        Patient has no prior history of Echocardiogram examinations.  Sonographer:    Elmarie Shiley Dance Referring Phys: [id number] MICHAEL COOPER IMPRESSIONS  1. Left ventricular ejection fraction, by estimation, is 55 to 60%. The left ventricle has normal function. The left ventricle has no regional wall motion abnormalities. There is mild left ventricular hypertrophy. Left ventricular diastolic parameters are consistent with Grade I diastolic dysfunction (impaired relaxation).  2. Right ventricular systolic function is normal. The right ventricular size is normal. Tricuspid regurgitation signal is inadequate for assessing PA pressure.  3. The mitral valve is abnormal. Trivial mitral valve regurgitation.  4. The aortic valve is tricuspid. Aortic valve regurgitation is not visualized.  5. The inferior vena cava is normal in size with greater than 50% respiratory variability, suggesting right atrial pressure of 3 mmHg. Comparison(s): No prior Echocardiogram. Conclusion(s)/Recommendation(s): Critical findings reported to Dr. Excell Seltzer and acknowledged at 12:24 pm on 05/09/20. FINDINGS  Left  Ventricle: Left ventricular ejection fraction, by estimation, is 55 to 60%. The left ventricle has normal function. The left ventricle has no regional wall motion abnormalities. The left ventricular internal cavity size was normal in size. There is  mild left ventricular hypertrophy. Left ventricular diastolic parameters  are consistent with Grade I diastolic dysfunction (impaired relaxation). Normal left ventricular filling pressure. Right Ventricle: The right ventricular size is normal. No increase in right ventricular wall thickness. Right ventricular systolic function is normal. Tricuspid regurgitation signal is inadequate for assessing PA pressure. Left Atrium: Left atrial size was normal in size. Right Atrium: Right atrial size was normal in size. Pericardium: There is no evidence of pericardial effusion. Mitral Valve: The mitral valve is abnormal. There is mild thickening of the mitral valve leaflet(s). Trivial mitral valve regurgitation. Tricuspid Valve: The tricuspid valve is grossly normal. Tricuspid valve regurgitation is trivial. Aortic Valve: The aortic valve is tricuspid. Aortic valve regurgitation is not visualized. Pulmonic Valve: The pulmonic valve was normal in structure. Pulmonic valve regurgitation is mild. Aorta: The aortic root and ascending aorta are structurally normal, with no evidence of dilitation. Venous: The inferior vena cava is normal in size with greater than 50% respiratory variability, suggesting right atrial pressure of 3 mmHg. IAS/Shunts: There is right bowing of the interatrial septum, suggestive of elevated left atrial pressure. No atrial level shunt detected by color flow Doppler.  LEFT VENTRICLE PLAX 2D LVIDd:         4.60 cm  Diastology LVIDs:         3.40 cm  LV e' medial:    8.38 cm/s LV PW:         1.30 cm  LV E/e' medial:  6.6 LV IVS:        1.30 cm  LV e' lateral:   10.10 cm/s LVOT diam:     2.50 cm  LV E/e' lateral: 5.5 LV SV:         90 LV SV Index:   41 LVOT Area:      4.91 cm  RIGHT VENTRICLE             IVC RV Basal diam:  3.30 cm     IVC diam: 1.70 cm RV Mid diam:    2.70 cm RV S prime:     12.10 cm/s TAPSE (M-mode): 1.6 cm LEFT ATRIUM             Index       RIGHT ATRIUM           Index LA diam:        4.30 cm 1.96 cm/m  RA Area:     14.20 cm LA Vol (A2C):   57.9 ml 26.37 ml/m RA Volume:   31.80 ml  14.48 ml/m LA Vol (A4C):   42.4 ml 19.31 ml/m LA Biplane Vol: 50.2 ml 22.86 ml/m  AORTIC VALVE LVOT Vmax:   101.00 cm/s LVOT Vmean:  62.500 cm/s LVOT VTI:    0.184 m  AORTA Ao Root diam: 3.50 cm Ao Asc diam:  3.70 cm MITRAL VALVE MV Area (PHT): 2.73 cm    SHUNTS MV Decel Time: 278 msec    Systemic VTI:  0.18 m MV E velocity: 55.30 cm/s  Systemic Diam: 2.50 cm MV A velocity: 66.00 cm/s MV E/A ratio:  0.84 Zoila Shutter MD Electronically signed by Zoila Shutter MD Signature Date/Time: 05/09/2020/12:26:30 PM    Final     Procedures .Critical Care Performed by: Cheryll Cockayne, MD Authorized by: Cheryll Cockayne, MD   Critical care provider statement:    Critical care time (minutes):  45   Critical care time was exclusive of:  Separately billable procedures and treating other patients and teaching time   Critical care was necessary  to treat or prevent imminent or life-threatening deterioration of the following conditions:  Cardiac failure   Critical care was time spent personally by me on the following activities:  Discussions with consultants, evaluation of patient's response to treatment, examination of patient, ordering and performing treatments and interventions, ordering and review of laboratory studies, ordering and review of radiographic studies, pulse oximetry, re-evaluation of patient's condition, obtaining history from patient or surrogate and review of old charts   (including critical care time)  Medications Ordered in ED Medications - No data to display  ED Course  I have reviewed the triage vital signs and the nursing notes.  Pertinent labs &  imaging results that were available during my care of the patient were reviewed by me and considered in my medical decision making (see chart for details).    MDM Rules/Calculators/A&P                          EKG reviewed by myself, sinus rhythm, normal rate, concerning for diffuse ST elevation, some PR depression noted as well.   Labs were sent.  Troponin returned elevated 53.  Patient will be brought to the medical service for further cardiac evaluation.   Final Clinical Impression(s) / ED Diagnoses Final diagnoses:  Chest pain, unspecified type  Elevated troponin    Rx / DC Orders ED Discharge Orders    None       Cheryll Cockayne, MD 05/09/20 [phone]

## 2020-05-10 ENCOUNTER — Encounter (HOSPITAL_COMMUNITY): Payer: Self-pay | Admitting: Internal Medicine

## 2020-05-10 ENCOUNTER — Observation Stay (HOSPITAL_COMMUNITY): Payer: 59

## 2020-05-10 DIAGNOSIS — Q245 Malformation of coronary vessels: Secondary | ICD-10-CM | POA: Diagnosis not present

## 2020-05-10 DIAGNOSIS — F1290 Cannabis use, unspecified, uncomplicated: Secondary | ICD-10-CM | POA: Diagnosis present

## 2020-05-10 DIAGNOSIS — I309 Acute pericarditis, unspecified: Secondary | ICD-10-CM | POA: Diagnosis present

## 2020-05-10 DIAGNOSIS — R778 Other specified abnormalities of plasma proteins: Secondary | ICD-10-CM | POA: Diagnosis not present

## 2020-05-10 DIAGNOSIS — E785 Hyperlipidemia, unspecified: Secondary | ICD-10-CM

## 2020-05-10 DIAGNOSIS — F1910 Other psychoactive substance abuse, uncomplicated: Secondary | ICD-10-CM

## 2020-05-10 DIAGNOSIS — R7303 Prediabetes: Secondary | ICD-10-CM

## 2020-05-10 DIAGNOSIS — F17290 Nicotine dependence, other tobacco product, uncomplicated: Secondary | ICD-10-CM | POA: Diagnosis present

## 2020-05-10 DIAGNOSIS — Z8249 Family history of ischemic heart disease and other diseases of the circulatory system: Secondary | ICD-10-CM | POA: Diagnosis not present

## 2020-05-10 DIAGNOSIS — I110 Hypertensive heart disease with heart failure: Secondary | ICD-10-CM | POA: Diagnosis present

## 2020-05-10 DIAGNOSIS — I5031 Acute diastolic (congestive) heart failure: Secondary | ICD-10-CM

## 2020-05-10 DIAGNOSIS — Z20822 Contact with and (suspected) exposure to covid-19: Secondary | ICD-10-CM | POA: Diagnosis present

## 2020-05-10 DIAGNOSIS — I319 Disease of pericardium, unspecified: Secondary | ICD-10-CM | POA: Diagnosis present

## 2020-05-10 DIAGNOSIS — R079 Chest pain, unspecified: Secondary | ICD-10-CM

## 2020-05-10 DIAGNOSIS — R0789 Other chest pain: Secondary | ICD-10-CM | POA: Diagnosis present

## 2020-05-10 DIAGNOSIS — M7918 Myalgia, other site: Secondary | ICD-10-CM | POA: Diagnosis present

## 2020-05-10 DIAGNOSIS — I161 Hypertensive emergency: Secondary | ICD-10-CM | POA: Diagnosis present

## 2020-05-10 DIAGNOSIS — R739 Hyperglycemia, unspecified: Secondary | ICD-10-CM | POA: Diagnosis present

## 2020-05-10 HISTORY — DX: Malformation of coronary vessels: Q24.5

## 2020-05-10 LAB — CBC
HCT: 42.8 % (ref 39.0–52.0)
Hemoglobin: 14.8 g/dL (ref 13.0–17.0)
MCH: 30.3 pg (ref 26.0–34.0)
MCHC: 34.6 g/dL (ref 30.0–36.0)
MCV: 87.7 fL (ref 80.0–100.0)
Platelets: 186 10*3/uL (ref 150–400)
RBC: 4.88 MIL/uL (ref 4.22–5.81)
RDW: 13.9 % (ref 11.5–15.5)
WBC: 6.4 10*3/uL (ref 4.0–10.5)
nRBC: 0 % (ref 0.0–0.2)

## 2020-05-10 LAB — RAPID URINE DRUG SCREEN, HOSP PERFORMED
Amphetamines: NOT DETECTED
Barbiturates: NOT DETECTED
Benzodiazepines: NOT DETECTED
Cocaine: NOT DETECTED
Opiates: NOT DETECTED
Tetrahydrocannabinol: POSITIVE — AB

## 2020-05-10 LAB — LIPID PANEL
Cholesterol: 191 mg/dL (ref 0–200)
HDL: 76 mg/dL (ref >40)
LDL Cholesterol: 105 mg/dL — ABNORMAL HIGH (ref 0–99)
Total CHOL/HDL Ratio: 2.5 RATIO
Triglycerides: 52 mg/dL (ref <150)
VLDL: 10 mg/dL (ref 0–40)

## 2020-05-10 LAB — BASIC METABOLIC PANEL
Anion gap: 13 (ref 5–15)
BUN: 10 mg/dL (ref 6–20)
CO2: 26 mmol/L (ref 22–32)
Calcium: 9.7 mg/dL (ref 8.9–10.3)
Chloride: 100 mmol/L (ref 98–111)
Creatinine, Ser: 0.94 mg/dL (ref 0.61–1.24)
GFR, Estimated: >60 mL/min (ref >60)
Glucose, Bld: 53 mg/dL — ABNORMAL LOW (ref 70–99)
Potassium: 3.9 mmol/L (ref 3.5–5.1)
Sodium: 139 mmol/L (ref 135–145)

## 2020-05-10 LAB — CBG MONITORING, ED: Glucose-Capillary: 116 mg/dL — ABNORMAL HIGH (ref 70–99)

## 2020-05-10 MED ORDER — METOPROLOL TARTRATE 5 MG/5ML IV SOLN
INTRAVENOUS | Status: AC
  Filled 2020-05-10: qty 5

## 2020-05-10 MED ORDER — CYCLOBENZAPRINE HCL 5 MG PO TABS
5.0000 mg | ORAL_TABLET | Freq: Three times a day (TID) | ORAL | Status: DC | PRN
  Administered 2020-05-10 – 2020-05-11 (×3): 5 mg via ORAL
  Filled 2020-05-10 (×4): qty 1

## 2020-05-10 MED ORDER — METOPROLOL TARTRATE 25 MG PO TABS
100.0000 mg | ORAL_TABLET | Freq: Once | ORAL | Status: AC
  Administered 2020-05-10: 10:00:00 100 mg via ORAL
  Filled 2020-05-10: qty 4

## 2020-05-10 MED ORDER — IOHEXOL 350 MG/ML SOLN
80.0000 mL | Freq: Once | INTRAVENOUS | Status: AC | PRN
  Administered 2020-05-10: 80 mL via INTRAVENOUS

## 2020-05-10 MED ORDER — NITROGLYCERIN 0.4 MG SL SUBL
SUBLINGUAL_TABLET | SUBLINGUAL | Status: AC
  Filled 2020-05-10: qty 2

## 2020-05-10 NOTE — ED Notes (Signed)
Patient given PRN Tylenol with no relief, covering provider contacted for additional order for pain medication

## 2020-05-10 NOTE — ED Notes (Signed)
Patient glucose on BMP resulted as 53, patient asymptomatic, given apple juice

## 2020-05-10 NOTE — Progress Notes (Addendum)
EKG done that shows Acute MI/ STEMI. Dr Jacques Navy (cardiology) & Dr Imogene Burn (IM)  Informed. Pt is chest pain free currently. Will continue to monitor pt as per Dr Jacques Navy.

## 2020-05-10 NOTE — ED Notes (Signed)
Patient transported to CT 

## 2020-05-10 NOTE — ED Notes (Signed)
Lunch Tray Ordered @ 1117. 

## 2020-05-10 NOTE — Progress Notes (Signed)
   Subjective:    Mr. Fester states that he did not sleep well last night due to continued right arm pain; however, he has experienced some relief with flexeril, and denies any pleuritic CP today after treatment for pericarditis was started. Describes his right arm pain as dull, not shooting. Denies SOB, weakness, symptoms of alcohol withdrawal.    Objective:  Vital signs in last 24 hours: Vitals:   05/10/20 1100 05/10/20 1111 05/10/20 1224 05/10/20 1225  BP: 122/90  124/89   Pulse: 64 66    Resp: 20 20    Temp:    98.8 F (37.1 C)  TempSrc:    Oral  SpO2: 99% 98%    Weight:      Height:       General: Resting comfortably in no acute distress.  Cardiology: RRR. No murmurs, rubs, or gallops.  Respiratory: Lungs CTA, bilaterally, breathing comfortably on room air without tachypnea.  Musculoskeletal: ROMI in LUE with normal muscle bulk and tone.   Assessment/Plan:  Active Problems:   Acute pericarditis  # Acute Pericarditis, Improving  Patient presented with 2 days of pleuritic L-sided CP. He received 324mg  ASA and 0.4mg  NTG in the ED. RT-PCR negative. Troponins 53 > 34 . Inflammatory markers minimally elevated. EKG showed diffuse PR depressions. CXR unremarkable. ECHO showed no regional wall motion abnormalities or pericardial effusion. Pleuritic CP resolved on below therapy. Cardiology on board who recommend coronary CTA.   - Ibuprofen 800mg  three times daily  - Colchicine 0.6mg  twice daily  - Continue Tylenol 650mg  q6 hours PRN for pain / fever - Check coronary CTA   # New-Onset HFpEF  ECHO showed mild LVH with Grade I Diastolic Dysfunction, EF 55-60%, and trivial mitral valve regurgitation. No priors for comparison. Patient denies LE swelling, orthopnea, weight gain. Likely in the setting of uncontrolled hypertension.    - Check coronary CTA  # Hyperlipidemia  LDL elevated to 105, although remainder of cholesterol panel WNL; ASCVD risk 5.6%.  - Will hold off on  statin at this time - Recommend outpatient follow up  # Hypertension Blood pressure has normalized on amlodipine and carvedilol.   - Continue amlodipine 5mg  daily - Continue carvedilol 12.5mg  twice daily with meals  - Continue to monitor   # Pre-Diabetes Hgb A1c 5.5. No known hx DM.   - Outpatient follow up   # Polysubstance Abuse  Patient smokes cigars and marijuana and drinks alcohol daily. Denies history of withdrawal or current withdrawal symptoms. UDS positive for THC only.   - Daily Multivitamin w/ Minerals  - Monitor for signs of withdrawal  Code Status: Full Code DVT PPx: Lovenox  Diet: Regular  IVF: None   Prior to Admission Living Arrangement: Home Anticipated Discharge Location: Home  Barriers to Discharge: Coronary Evaluation  Dispo: Pending Coronary Evaluation  , MD 05/10/2020, 1:51 PM Pager: [phone] After 5pm on weekdays and 1pm on weekends: On Call pager [phone]

## 2020-05-10 NOTE — Consult Note (Signed)
[address]       [place] [address]             [phone]        KALETH KOY [facility] Medical Record #[id number] Date of Birth: 1975-06-21  Referring: No ref. provider found Primary Care: Patient, No Pcp Per Primary Cardiologist:Gayatri Wynell Balloon, MD  Chief Complaint:    Chief Complaint  Patient presents with   Chest Pain    History of Present Illness:     Mr. Pietrzak is a 44 year old gentleman with no significant past medical history that presents to the emergency department with chest pain.  Patient states that while he was raising his grandchild he noticed some left arm pain that persisted for several hours.  On presentation to the emergency department he had diffuse ST elevations, and the patient was subsequently transferred to [facility] for further evaluation.  He currently is asymptomatic.  He has been very active in the past, and has not had any similar events.  He does have a history of marijuana use along with alcohol intake.   Past Medical and Surgical History: Previous Chest Surgery: None Previous Chest Radiation: None Diabetes Mellitus: No.  HbA1C 5.5 Creatinine: 0.94  History reviewed. No pertinent past medical history.  Past Surgical History:  Procedure Laterality Date   KNEE SURGERY Right 1992   LAPAROSCOPIC APPENDECTOMY N/A 01/28/2015   Procedure: APPENDECTOMY LAPAROSCOPIC;  Surgeon: Claud Kelp, MD;  Location: [facility];  Service: General;  Laterality: N/A;    Social History: Support: Wife is at the bedside  Social History   Tobacco Use  Smoking Status Former Smoker   Types: Cigars  Smokeless Tobacco Never Used    Social History   Substance and Sexual Activity  Alcohol Use Yes   Comment: social     No Known Allergies  Medications: Asprin: No Statin: No Beta Blocker: Carvedilol Ace Inhibitor: No. Anti-Coagulation: No  Current Facility-Administered Medications  Medication Dose Route Frequency Provider  Last Rate Last Admin   acetaminophen (TYLENOL) tablet 650 mg  650 mg Oral Q6H PRN Verdene Lennert, MD   650 mg at 05/10/20 0407   Or   acetaminophen (TYLENOL) suppository 650 mg  650 mg Rectal Q6H PRN Verdene Lennert, MD       amLODipine (NORVASC) tablet 5 mg  5 mg Oral Daily Marjie Skiff E, PA-C   5 mg at 05/10/20 1224   carvedilol (COREG) tablet 12.5 mg  12.5 mg Oral BID WC Irene Limbo, Callie E, PA-C       colchicine tablet 0.6 mg  0.6 mg Oral BID Marjie Skiff E, PA-C   0.6 mg at 05/10/20 1225   cyclobenzaprine (FLEXERIL) tablet 5 mg  5 mg Oral TID PRN Remo Lipps, MD   5 mg at 05/10/20 0605   enoxaparin (LOVENOX) injection 40 mg  40 mg Subcutaneous Q24H Verdene Lennert, MD   40 mg at 05/09/20 1758   ibuprofen (ADVIL) tablet 800 mg  800 mg Oral TID Marjie Skiff E, PA-C   800 mg at 05/10/20 1225   metoprolol tartrate (LOPRESSOR) 5 MG/5ML injection            multivitamin with minerals tablet 1 tablet  1 tablet Oral Daily Verdene Lennert, MD   1 tablet at 05/10/20 1224   nitroGLYCERIN (NITROSTAT) 0.4 MG SL tablet            polyethylene glycol (MIRALAX / GLYCOLAX) packet 17 g  17 g Oral Daily PRN Verdene Lennert, MD       sodium chloride flush (NS) 0.9 % injection 3 mL  3 mL Intravenous Q12H Verdene Lennert, MD   3 mL at 05/10/20 1223    Medications Prior to Admission  Medication Sig Dispense Refill Last Dose   acetaminophen (TYLENOL) 500 MG tablet Take 1,000 mg by mouth every 6 (six) hours as needed (for pain).   05/08/2020 at Unknown time    Family History  Problem Relation Age of Onset   Hypertension Mother    Crohn's disease Daughter      Review of Systems:   Review of Systems  Respiratory: Negative.   Cardiovascular: Negative.   All other systems reviewed and are negative.     Physical Exam: BP (!) 155/98 (BP Location: Left Arm)    Pulse 72    Temp 98.6 F (37 C) (Oral)    Resp 16    Ht 5\' 11"  (1.803 m)    Wt 99.8 kg    SpO2 99%    BMI 30.68  kg/m  Physical Exam Constitutional:      General: He is not in acute distress.    Appearance: He is well-developed and normal weight. He is not ill-appearing or toxic-appearing.  HENT:     Head: Normocephalic and atraumatic.  Eyes:     Extraocular Movements: Extraocular movements intact.  Cardiovascular:     Rate and Rhythm: Normal rate.  Pulmonary:     Effort: Pulmonary effort is normal.  Musculoskeletal:        General: Normal range of motion.  Neurological:     General: No focal deficit present.     Mental Status: He is alert and oriented to person, place, and time.       Diagnostic Studies & Laboratory data: Coronary CT from 05/10/2020 was reviewed.  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  He has a slitlike orifice.,  And the artery takes an intra-arterial course between the aorta and the pulmonary artery.  There does appear to be mild systolic compression of the left coronary artery.  There is no significant coronary artery disease.    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  I do think that his symptoms are related to the  malignant course of his left coronary artery.  He is scheduled to undergo exercise stress testing tomorrow.  If this is concerning for coronary ischemia then we will plan for a coronary unroofing/reimplantation on 05/12/2020.  If the  exercise stress testing is negative then he can be discharged and follow-up with me next week to discuss elective repair.     I  spent 25 minutes counseling the patient face to face.   [name] 05/10/2020 5:13 PM

## 2020-05-10 NOTE — Progress Notes (Addendum)
Progress Note  Patient Name: Kevin Combs Date of Encounter: 05/10/2020  [facility] Cardiologist: Parke Poisson, MD   Subjective   Chest pain is improving. Only occurs with deep breath. Complains of L shoulder pain that radiates to his arm. Start at neck.   Inpatient Medications    Scheduled Meds: . amLODipine  5 mg Oral Daily  . carvedilol  12.5 mg Oral BID WC  . colchicine  0.6 mg Oral BID  . enoxaparin (LOVENOX) injection  40 mg Subcutaneous Q24H  . ibuprofen  800 mg Oral TID  . metoprolol tartrate  100 mg Oral Once  . multivitamin with minerals  1 tablet Oral Daily  . sodium chloride flush  3 mL Intravenous Q12H   Continuous Infusions:  PRN Meds: acetaminophen **OR** acetaminophen, cyclobenzaprine, polyethylene glycol   Vital Signs    Vitals:   05/10/20 0848 05/10/20 0849 05/10/20 0850 05/10/20 0922  BP:      Pulse: 84 90 66   Resp: 19 (!) 36 17   Temp:    98.4 F (36.9 C)  TempSrc:    Oral  SpO2: 98% 100% 98%   Weight:      Height:       No intake or output data in the 24 hours ending 05/10/20 0937 Last 3 Weights 05/09/2020 12/22/2019 12/08/2017  Weight (lbs) 220 lb 218 lb 223 lb 9.6 oz  Weight (kg) 99.791 kg 98.884 kg 101.424 kg      Telemetry    Sinus rhythm/tachycardia  - Personally Reviewed  ECG    N/A  Physical Exam   GEN: No acute distress.   Neck: No JVD Cardiac: RRR, no murmurs, rubs, or gallops.  Respiratory: Clear to auscultation bilaterally. GI: Soft, nontender, non-distended  MS: No edema; No deformity. Neuro:  Nonfocal  Psych: Normal affect   Labs    High Sensitivity Troponin:   Recent Labs  Lab 05/09/20 1128 05/09/20 1322  TROPONINIHS 53* 34*      Chemistry Recent Labs  Lab 05/09/20 1128 05/10/20 0300  NA 137 139  K 3.6 3.9  CL 99 100  CO2 25 26  GLUCOSE 104* 53*  BUN 11 10  CREATININE 1.04 0.94  CALCIUM 9.1 9.7  GFRNONAA >60 >60  ANIONGAP 13 13     Hematology Recent Labs  Lab 05/09/20 1128  05/10/20 0300  WBC 5.6 6.4  RBC 4.90 4.88  HGB 14.8 14.8  HCT 42.6 42.8  MCV 86.9 87.7  MCH 30.2 30.3  MCHC 34.7 34.6  RDW 13.6 13.9  PLT 187 186     Radiology    DG Chest 2 View  Result Date: 05/09/2020 CLINICAL DATA:  Left arm pain which began 05/07/2020 has migrated into the chest. EXAM: CHEST - 2 VIEW COMPARISON:  PA and lateral chest 02/02/2013. FINDINGS: Lungs clear. Heart size normal. No pneumothorax or pleural fluid. No bony abnormality. IMPRESSION: Normal chest. Electronically Signed   By: Drusilla Kanner M.D.   On: 05/09/2020 11:44   ECHOCARDIOGRAM COMPLETE  Result Date: 05/09/2020    ECHOCARDIOGRAM REPORT   Patient Name:   Kevin Combs Date of Exam: 05/09/2020 Medical Rec #:  [id number]     Height:       71.0 in Accession #:    [id number]    Weight:       220.0 lb Date of Birth:  23-Oct-1975     BSA:          2.196 m Patient  Age:    44 years      BP:           143/102 mmHg Patient Gender: M             HR:           88 bpm. Exam Location:  Inpatient Procedure: 2D Echo, Cardiac Doppler and Color Doppler                       STAT ECHO Reported to: Dr Italy Hilty on 05/09/2020 12:16:00 PM. Indications:    STEMI I21.3  History:        Patient has no prior history of Echocardiogram examinations.  Sonographer:    Elmarie Shiley Dance Referring Phys: [id number] MICHAEL COOPER IMPRESSIONS  1. Left ventricular ejection fraction, by estimation, is 55 to 60%. The left ventricle has normal function. The left ventricle has no regional wall motion abnormalities. There is mild left ventricular hypertrophy. Left ventricular diastolic parameters are consistent with Grade I diastolic dysfunction (impaired relaxation).  2. Right ventricular systolic function is normal. The right ventricular size is normal. Tricuspid regurgitation signal is inadequate for assessing PA pressure.  3. The mitral valve is abnormal. Trivial mitral valve regurgitation.  4. The aortic valve is tricuspid. Aortic valve regurgitation is not  visualized.  5. The inferior vena cava is normal in size with greater than 50% respiratory variability, suggesting right atrial pressure of 3 mmHg. Comparison(s): No prior Echocardiogram. Conclusion(s)/Recommendation(s): Critical findings reported to Dr. Excell Seltzer and acknowledged at 12:24 pm on 05/09/20. FINDINGS  Left Ventricle: Left ventricular ejection fraction, by estimation, is 55 to 60%. The left ventricle has normal function. The left ventricle has no regional wall motion abnormalities. The left ventricular internal cavity size was normal in size. There is  mild left ventricular hypertrophy. Left ventricular diastolic parameters are consistent with Grade I diastolic dysfunction (impaired relaxation). Normal left ventricular filling pressure. Right Ventricle: The right ventricular size is normal. No increase in right ventricular wall thickness. Right ventricular systolic function is normal. Tricuspid regurgitation signal is inadequate for assessing PA pressure. Left Atrium: Left atrial size was normal in size. Right Atrium: Right atrial size was normal in size. Pericardium: There is no evidence of pericardial effusion. Mitral Valve: The mitral valve is abnormal. There is mild thickening of the mitral valve leaflet(s). Trivial mitral valve regurgitation. Tricuspid Valve: The tricuspid valve is grossly normal. Tricuspid valve regurgitation is trivial. Aortic Valve: The aortic valve is tricuspid. Aortic valve regurgitation is not visualized. Pulmonic Valve: The pulmonic valve was normal in structure. Pulmonic valve regurgitation is mild. Aorta: The aortic root and ascending aorta are structurally normal, with no evidence of dilitation. Venous: The inferior vena cava is normal in size with greater than 50% respiratory variability, suggesting right atrial pressure of 3 mmHg. IAS/Shunts: There is right bowing of the interatrial septum, suggestive of elevated left atrial pressure. No atrial level shunt detected by  color flow Doppler.  LEFT VENTRICLE PLAX 2D LVIDd:         4.60 cm  Diastology LVIDs:         3.40 cm  LV e' medial:    8.38 cm/s LV PW:         1.30 cm  LV E/e' medial:  6.6 LV IVS:        1.30 cm  LV e' lateral:   10.10 cm/s LVOT diam:     2.50 cm  LV E/e' lateral: 5.5 LV SV:  90 LV SV Index:   41 LVOT Area:     4.91 cm  RIGHT VENTRICLE             IVC RV Basal diam:  3.30 cm     IVC diam: 1.70 cm RV Mid diam:    2.70 cm RV S prime:     12.10 cm/s TAPSE (M-mode): 1.6 cm LEFT ATRIUM             Index       RIGHT ATRIUM           Index LA diam:        4.30 cm 1.96 cm/m  RA Area:     14.20 cm LA Vol (A2C):   57.9 ml 26.37 ml/m RA Volume:   31.80 ml  14.48 ml/m LA Vol (A4C):   42.4 ml 19.31 ml/m LA Biplane Vol: 50.2 ml 22.86 ml/m  AORTIC VALVE LVOT Vmax:   101.00 cm/s LVOT Vmean:  62.500 cm/s LVOT VTI:    0.184 m  AORTA Ao Root diam: 3.50 cm Ao Asc diam:  3.70 cm MITRAL VALVE MV Area (PHT): 2.73 cm    SHUNTS MV Decel Time: 278 msec    Systemic VTI:  0.18 m MV E velocity: 55.30 cm/s  Systemic Diam: 2.50 cm MV A velocity: 66.00 cm/s MV E/A ratio:  0.84 Zoila Shutter MD Electronically signed by Zoila Shutter MD Signature Date/Time: 05/09/2020/12:26:30 PM    Final     Cardiac Studies    Echocardiogram 05/09/2020 1. Left ventricular ejection fraction, by estimation, is 55 to 60%. The  left ventricle has normal function. The left ventricle has no regional  wall motion abnormalities. There is mild left ventricular hypertrophy.  Left ventricular diastolic parameters  are consistent with Grade I diastolic dysfunction (impaired relaxation).  2. Right ventricular systolic function is normal. The right ventricular  size is normal. Tricuspid regurgitation signal is inadequate for assessing  PA pressure.  3. The mitral valve is abnormal. Trivial mitral valve regurgitation.  4. The aortic valve is tricuspid. Aortic valve regurgitation is not  visualized.  5. The inferior vena cava is normal in  size with greater than 50%  respiratory variability, suggesting right atrial pressure of 3 mmHg.   Patient Profile     44 y.o. male male with no known significant past medical history except tobacco smoking, marijuana smoking and alcohol abuse who  transferred from Urgent Care as Code STEMI after presenting with left arm pain x2 days and pleuritic chest pain. Found to be hypertensive.  EKG showed diffuse ST elevations and PR depressions consistent with pericarditis. Code STEMI cancelled.   Assessment & Plan    1. Pericarditis - Hs-troponin 53>>34.  Not consistent with ACS. Echo showed LVEF of 55-60% with normal wall motion, mild LVH, and grade 1 diastolic dysfunction. Minimally elevated sed rate at 2 and CRP at 4.5. - Symptoms improving on antiinflammatory  - Treat with ibuprofen 800 mg TID x  1 month and colchicine 0.6 mg BID for 3 months - Denies recent illness or COVID vaccine - Plan for coronary CTA today  2. Hypertensive urgency - BP improving >> up-titrate antihypertensive as needed  3. Shoulder/Arm pain - Likely MSK  In nature - CTA today to r/o CAD - Work up per primary team   4. Polysubstance abuse - Recommended cessation   For questions or updates, please contact [facility] Please consult [email or web address] for contact info under  Lorelei PontSigned, Bhavinkumar Bhagat, PA  05/10/2020, 9:37 AM    Patient seen and examined with Chelsea AusVin Bhagat PA.  Agree as above, with the following exceptions and changes as noted below. He has mild chest pain with deep inspiration. CRP mildly elevated but sed rate is normal. ECG suggestive of pericarditis, however anomalous coronary artery seen. The left main coronary artery Gen: NAD, CV: RRR, no murmurs, Lungs: clear, Abd: soft, Extrem: Warm, well perfused, no edema, Neuro/Psych: alert and oriented x 3, normal mood and affect. All available labs, radiology testing, previous records reviewed.   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 We will send the study for FFR flow evaluation, and will tentatively plan for treadmill stress nuclear study tomorrow to evaluate for ischemia. I have also consulted [facility] Dr. Cliffton AstersLightfoot, who has seen the patient and discussed options. Will be critical to get treadmill ischemic evaluation tomorrow to determine risk stratification. 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 No issues ever came up on his sports physicals.   Will repeat ECG to evaluate if ST changes persist. While suggestive of pericarditis, concern in the setting of new finding of LM anomalous origin.   Parke PoissonGayatri A Brant Peets, MD 05/10/20 4:54 PM

## 2020-05-11 ENCOUNTER — Inpatient Hospital Stay (HOSPITAL_COMMUNITY): Payer: 59

## 2020-05-11 DIAGNOSIS — R079 Chest pain, unspecified: Secondary | ICD-10-CM

## 2020-05-11 LAB — COMPREHENSIVE METABOLIC PANEL
ALT: 30 U/L (ref 0–44)
AST: 27 U/L (ref 15–41)
Albumin: 3.9 g/dL (ref 3.5–5.0)
Alkaline Phosphatase: 46 U/L (ref 38–126)
Anion gap: 13 (ref 5–15)
BUN: 17 mg/dL (ref 6–20)
CO2: 23 mmol/L (ref 22–32)
Calcium: 9.5 mg/dL (ref 8.9–10.3)
Chloride: 101 mmol/L (ref 98–111)
Creatinine, Ser: 1.01 mg/dL (ref 0.61–1.24)
GFR, Estimated: >60 mL/min (ref >60)
Glucose, Bld: 81 mg/dL (ref 70–99)
Potassium: 3.6 mmol/L (ref 3.5–5.1)
Sodium: 137 mmol/L (ref 135–145)
Total Bilirubin: 1.7 mg/dL — ABNORMAL HIGH (ref 0.3–1.2)
Total Protein: 7.3 g/dL (ref 6.5–8.1)

## 2020-05-11 LAB — NM MYOCAR MULTI W/SPECT W/WALL MOTION / EF
Estimated workload: 13.4 METS
Exercise duration (min): 11 min
Exercise duration (sec): 30 s
MPHR: 175 {beats}/min
Peak HR: 173 {beats}/min
Percent HR: 98 %
RPE: 14
Rest HR: 78 {beats}/min

## 2020-05-11 MED ORDER — COLCHICINE 0.6 MG PO TABS: 0.6000 mg | ORAL_TABLET | Freq: Two times a day (BID) | ORAL | 0 refills | Status: DC

## 2020-05-11 MED ORDER — TECHNETIUM TC 99M TETROFOSMIN IV KIT
10.1000 | PACK | Freq: Once | INTRAVENOUS | Status: AC | PRN
  Administered 2020-05-11: 11:00:00 10.1 via INTRAVENOUS

## 2020-05-11 MED ORDER — CARVEDILOL 12.5 MG PO TABS: 12.5000 mg | ORAL_TABLET | Freq: Two times a day (BID) | ORAL | 0 refills | Status: DC

## 2020-05-11 MED ORDER — CYCLOBENZAPRINE HCL 5 MG PO TABS: 5.0000 mg | ORAL_TABLET | Freq: Three times a day (TID) | ORAL | 0 refills | Status: DC | PRN

## 2020-05-11 MED ORDER — TECHNETIUM TC 99M TETROFOSMIN IV KIT
32.6000 | PACK | Freq: Once | INTRAVENOUS | Status: AC | PRN
  Administered 2020-05-11: 32.6 via INTRAVENOUS

## 2020-05-11 MED ORDER — CARVEDILOL 12.5 MG PO TABS
12.5000 mg | ORAL_TABLET | Freq: Two times a day (BID) | ORAL | Status: DC
  Filled 2020-05-11: qty 1

## 2020-05-11 MED ORDER — IBUPROFEN 800 MG PO TABS: 800.0000 mg | ORAL_TABLET | Freq: Three times a day (TID) | ORAL | 0 refills | Status: DC

## 2020-05-11 MED ORDER — AMLODIPINE BESYLATE 10 MG PO TABS
10.0000 mg | ORAL_TABLET | Freq: Every day | ORAL | Status: DC
  Filled 2020-05-11: qty 1

## 2020-05-11 MED ORDER — AMLODIPINE BESYLATE 10 MG PO TABS: 10.0000 mg | ORAL_TABLET | Freq: Every day | ORAL | 0 refills | Status: DC

## 2020-05-11 NOTE — Progress Notes (Addendum)
Pt transported to nuc med via wheelchair by transporter, pt appears in no distress

## 2020-05-11 NOTE — Progress Notes (Addendum)
Progress Note  Patient Name: Kevin Combs Date of Encounter: 05/11/2020  [facility] Cardiologist: Parke Poisson, MD   Subjective   Feeling well. No chest pain, sob or palpitations.    Inpatient Medications    Scheduled Meds: . amLODipine  5 mg Oral Daily  . carvedilol  12.5 mg Oral BID WC  . colchicine  0.6 mg Oral BID  . enoxaparin (LOVENOX) injection  40 mg Subcutaneous Q24H  . ibuprofen  800 mg Oral TID  . multivitamin with minerals  1 tablet Oral Daily  . sodium chloride flush  3 mL Intravenous Q12H   Continuous Infusions:  PRN Meds: acetaminophen **OR** acetaminophen, cyclobenzaprine, polyethylene glycol   Vital Signs    Vitals:   05/11/20 1221 05/11/20 1224 05/11/20 1227 05/11/20 1228  BP: (!) 179/101 (!) 183/102 (!) 146/89 (!) 127/101  Pulse:      Resp:      Temp:      TempSrc:      SpO2:      Weight:      Height:       No intake or output data in the 24 hours ending 05/11/20 1241 Last 3 Weights 05/09/2020 12/22/2019 12/08/2017  Weight (lbs) 220 lb 218 lb 223 lb 9.6 oz  Weight (kg) 99.791 kg 98.884 kg 101.424 kg      Telemetry    NSR - Personally Reviewed  ECG    N/A  Physical Exam   GEN: No acute distress.   Neck: No JVD Cardiac: RRR, no murmurs, rubs, or gallops.  Respiratory: Clear to auscultation bilaterally. GI: Soft, nontender, non-distended  MS: No edema; No deformity. Neuro:  Nonfocal  Psych: Normal affect   Labs    High Sensitivity Troponin:   Recent Labs  Lab 05/09/20 1128 05/09/20 1322  TROPONINIHS 53* 34*      Chemistry Recent Labs  Lab 05/09/20 1128 05/10/20 0300 05/11/20 0356  NA 137 139 137  K 3.6 3.9 3.6  CL 99 100 101  CO2 25 26 23   GLUCOSE 104* 53* 81  BUN 11 10 17   CREATININE 1.04 0.94 1.01  CALCIUM 9.1 9.7 9.5  PROT  --   --  7.3  ALBUMIN  --   --  3.9  AST  --   --  27  ALT  --   --  30  ALKPHOS  --   --  46  BILITOT  --   --  1.7*  GFRNONAA >60 >60 >60  ANIONGAP 13 13 13       Hematology Recent Labs  Lab 05/09/20 1128 05/10/20 0300  WBC 5.6 6.4  RBC 4.90 4.88  HGB 14.8 14.8  HCT 42.6 42.8  MCV 86.9 87.7  MCH 30.2 30.3  MCHC 34.7 34.6  RDW 13.6 13.9  PLT 187 186    Radiology    CT CORONARY MORPH W/CTA COR W/SCORE W/CA W/CM &/OR WO/CM  Addendum Date: 05/10/2020   ADDENDUM REPORT: 05/10/2020 15:50 HISTORY: Chest pain/anginal equiv, ECGs or troponins abnormal EXAM: Cardiac/Coronary  CT TECHNIQUE: The patient was scanned on a 05/12/20. PROTOCOL: A 120 kV prospective scan was triggered in the descending thoracic aorta at 111 HU's. Axial non-contrast 3 mm slices were carried out through the heart. The data set was analyzed on a dedicated work station and scored using the Agatson method. Gantry rotation speed was 250 msecs and collimation was .6 mm. Beta blockade and 0.8 mg of sl NTG was given. The 3D  data set was reconstructed in 5% intervals of the 67-82 % of the R-R cycle. Diastolic phases were analyzed on a dedicated work station using MPR, MIP and VRT modes. The patient received 80mL OMNIPAQUE IOHEXOL 350 MG/ML SOLN of contrast. FINDINGS: Image quality: Good. Noise artifact is: Limited. Coronary calcium score is 0. Coronary arteries: Right dominance. 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 It takes an interarterial course between the aorta and pulmonary artery. Ostium is slit like appearance, and the body of the left main coronary artery may be partially intramural. There is mild systolic compression of the left main coronary artery. The left main then trifurcates into the LAD, Ramus intermedius, and L circumflex arteries. The RCA has a normal origin and course, and is dominant. Right Coronary Artery: No detectable plaque or stenosis. Left Main Coronary Artery: No detectable plaque or stenosis. Left Anterior Descending Coronary Artery: No detectable plaque or stenosis. Ramus intermedius: No detectable  plaque or stenosis. Left Circumflex Artery: No detectable plaque or stenosis. Aorta: Normal size, 35 mm at the mid ascending aorta (level of the PA bifurcation) measured double oblique. No calcifications. No dissection. Aortic Valve: No calcifications. Other findings: Normal pulmonary vein drainage into the left atrium. Normal left atrial appendage without a thrombus. Normal caliber main pulmonary artery. IMPRESSION: 1. 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 Ostium has slit-like appearance, and there is mild systolic compression of the body of the left main coronary artery. Right dominant circulation. CT FFR will be performed and reported separately for ischemic evaluation. 2.  No evidence of CAD, CADRADS = 0. 3. Coronary calcium score of 0. Electronically Signed   By: Weston Brass   On: 05/10/2020 15:50   Result Date: 05/10/2020 EXAM: OVER-READ INTERPRETATION  CT CHEST The following report is an over-read performed by radiologist Dr. Jeronimo Greaves of [facility] on 05/10/2020. This over-read does not include interpretation of cardiac or coronary anatomy or pathology. The coronary CTA interpretation by the cardiologist is attached. COMPARISON:  Chest radiograph 1 day prior. FINDINGS: Vascular: Normal aortic caliber. No central pulmonary embolism, on this non-dedicated study. Mediastinum/Nodes: No imaged thoracic adenopathy. Lungs/Pleura: Trace left pleural fluid. Left base subsegmental atelectasis. Upper Abdomen: Normal imaged portions of the liver, spleen, stomach. Musculoskeletal: No acute osseous abnormality. IMPRESSION: Trace left pleural fluid and left base subsegmental atelectasis. Although no pulmonary embolism is identified on this nondedicated study, pleural fluid and atelectasis can be sitting in the setting of pulmonary emboli. If left-sided pulmonary embolism is a clinical concern, consider  dedicated CTA of the chest. Electronically Signed: By: Jeronimo Greaves M.D. On: 05/10/2020 12:59    Cardiac Studies   Coronary CT 05/10/2020 IMPRESSION: 1. 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 Ostium has slit-like appearance, and there is mild systolic compression of the body of the left main coronary artery. Right dominant circulation. CT FFR will be performed and reported separately for ischemic evaluation.   2.  No evidence of CAD, CADRADS = 0.   3. Coronary calcium score of 0.  Echo 05/09/2020 1. Left ventricular ejection fraction, by estimation, is 55 to 60%. The  left ventricle has normal function. The left ventricle has no regional  wall motion abnormalities. There is mild left ventricular hypertrophy.  Left ventricular diastolic parameters  are consistent with  Grade I diastolic dysfunction (impaired relaxation).   2. Right ventricular systolic function is normal. The right ventricular  size is normal. Tricuspid regurgitation signal is inadequate for assessing  PA pressure.   3. The mitral valve is abnormal. Trivial mitral valve regurgitation.   4. The aortic valve is tricuspid. Aortic valve regurgitation is not  visualized.   5. The inferior vena cava is normal in size with greater than 50%  respiratory variability, suggesting right atrial pressure of 3 mmHg.  Patient Profile     44 y.o. male male with no known significant past medical history except tobacco smoking, marijuana smoking and alcohol abuse who  transferred from Urgent Care as Code STEMI after presenting with left arm pain x2 days and pleuritic chest pain. Found to be hypertensive.  EKG showed diffuse ST elevations and PR depressions consistent with pericarditis. Code STEMI cancelled.   Assessment & Plan    1. Pericarditis - Hs-troponin 53>>34.  Not consistent with ACS. Echo showed LVEF of 55-60% with normal wall  motion, mild LVH, and grade 1 diastolic dysfunction. Minimally elevated sed rate at 2 and CRP at 4.5. - Symptoms improving on antiinflammatory  - Treat with ibuprofen 800 mg TID x  1 month and colchicine 0.6 mg BID for 3 months - Denies recent illness or COVID vaccine -Coronary CTA today with coronary calcium score of 0.  2. 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 Hypertensive urgency  - BP stabilized on amlodipine and coreg  For questions or updates, please contact [facility] Please consult [email or web address] for contact info under        Signed, Manson Passey, PA  05/11/2020, 12:41 PM    Patient seen and examined in the stress lab for his exercise nuclear perfusion study.  He is feeling well and his left arm discomfort has resolved.  This may be because we have put him on ibuprofen and colchicine.  His resting ECG at initial consultation was highly suggestive of pericarditis, however now that we know he has an anomalous left main coronary artery with a malignant course, these ECG changes may be representative of transient ischemia.  Difficult to tell.  Would be reasonable to continue ibuprofen and colchicine at least until outpatient follow-up to monitor for resolution of left arm pain and some mild pleuritic chest pain.  I was present for the patient stress treadmill nuclear study, he performed extremely well with an excellent exercise capacity.  Post exercise stress images will now be performed.  If there is no ischemia on his stress images, I will discuss his case with Dr. Cliffton Asters and we can determine next best steps.  Of note his CT FFR showed no hemodynamically significant flow limitation.

## 2020-05-11 NOTE — Progress Notes (Signed)
   Subjective:   Kevin Combs states that he feels much better this morning, without any chest pain or right arm pain / tingling. He denies CP, SOB. He is eager to have his imaging of his heart.   Objective:  Vital signs in last 24 hours: Vitals:   05/10/20 1400 05/10/20 1600 05/11/20 0010 05/11/20 0512  BP: 115/66 (!) 155/98 102/72 114/81  Pulse: 73 72 69 60  Resp: 16 16 16 18   Temp: 98.4 F (36.9 C) 98.6 F (37 C) 98.2 F (36.8 C) 98.4 F (36.9 C)  TempSrc: Oral Oral Oral Oral  SpO2: 99%  99% 96%  Weight:      Height:       General: Resting comfortably in no acute distress.  Cardiology: RRR. No murmurs, rubs, or gallops.  Respiratory: Lungs CTA, bilaterally, breathing comfortably on room air without tachypnea.  Psychiatric: slightly flat affect. Pleasant and cooperative.   Assessment/Plan:  Active Problems:   Acute pericarditis   Anomalous left coronary artery   Chest pain   Elevated troponin   Pericarditis  # Acute Pericarditis, Improving  Patient presented with 2 days of pleuritic CP. Troponins 53 > 34 . Inflammatory markers minimally elevated. EKG showed diffuse PR depressions. Repeat EKG consistent with prior, without new concern for ischemia. CXR unremarkable. ECHO showed no regional wall motion abnormalities or pericardial effusion. Coronary CTA showed findings concerning for malignant coronary artery (see below). CP resolved.  - Ibuprofen 800mg  three times daily  - Colchicine 0.6mg  twice daily  - Continue Tylenol 650mg  q6 hours PRN for pain / fever  # New-Onset HFpEF  ECHO showed mild LVH with Grade I Diastolic Dysfunction, EF 55-60%, and trivial mitral valve regurgitation. No priors for comparison. Patient denies LE swelling, orthopnea, weight gain. Likely in the setting of uncontrolled hypertension.    - Check coronary CTA  # Hyperlipidemia  LDL elevated to 105, although remainder of cholesterol panel WNL; ASCVD risk 5.6%.  - Will hold off on statin at  this time - Recommend outpatient follow up  # Hypertension Blood pressure has normalized on amlodipine and carvedilol.   - Continue amlodipine 5mg  daily - Continue carvedilol 12.5mg  twice daily with meals  - Continue to monitor   # Pre-Diabetes Hgb A1c 5.5. No known hx DM.   - Outpatient follow up   # Polysubstance Abuse  Patient smokes cigars and marijuana and drinks alcohol daily. Denies history of withdrawal or current withdrawal symptoms. UDS positive for THC only.   - Daily Multivitamin w/ Minerals  - Monitor for signs of withdrawal  Code Status: Full Code DVT PPx: Lovenox  Diet: Regular  IVF: None   Prior to Admission Living Arrangement: Home Anticipated Discharge Location: Home  Barriers to Discharge: Coronary Evaluation  Dispo: Pending Coronary Evaluation   , MD 05/11/2020, 9:54 AM Pager: [phone] After 5pm on weekdays and 1pm on weekends: On Call pager [phone]

## 2020-05-11 NOTE — Discharge Summary (Signed)
Name: Kevin Combs MRN: [id number] DOB: 08-03-1975 44 y.o. PCP: Patient, No Pcp Per  Date of Admission: 05/09/2020 11:14 AM Date of Discharge: 05/11/2020 Attending Physician: Dr. Velna Ochs   Discharge Diagnosis:  Active Problems:   Acute pericarditis   Anomalous left coronary artery   Chest pain   Elevated troponin   Pericarditis  1. 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 Hypertensive Urgency  5. Hyperlipidemia  6. Polysubstance Abuse  7. Hyperbilirubinemia  8. LUE Musculoskeletal Pain  Discharge Medications: Allergies as of 05/11/2020   No Known Allergies     Medication List    TAKE these medications   acetaminophen 500 MG tablet Commonly known as: TYLENOL Take 1,000 mg by mouth every 6 (six) hours as needed (for pain).   amLODipine 10 MG tablet Commonly known as: NORVASC Take 1 tablet (10 mg total) by mouth daily.   carvedilol 12.5 MG tablet Commonly known as: COREG Take 1 tablet (12.5 mg total) by mouth 2 (two) times daily with a meal.   colchicine 0.6 MG tablet Take 1 tablet (0.6 mg total) by mouth 2 (two) times daily.   cyclobenzaprine 5 MG tablet Commonly known as: FLEXERIL Take 1 tablet (5 mg total) by mouth 3 (three) times daily as needed for muscle spasms (shoulder pain).   ibuprofen 800 MG tablet Commonly known as: ADVIL Take 1 tablet (800 mg total) by mouth 3 (three) times daily.       Disposition and follow-up:   Mr.Kevin Combs was discharged from [facility] in Stable condition.  At the hospital follow up visit please address:  1.  Acute Issues:  Acute Pericarditis / Anomalous L main coronary artery - Please make sure patient attends follow up appointment with Dr. Kipp Brood ~ 1 week after discharge and continues to take ibuprofen and colchicine until that time. Assess CP symptoms.   New-Onset HFmrEF / HTN Urgency - Be sure patient is taking amlodipine 93m  daily and carvedilol as prescribed. New onset HTN, will need followed.  Polysubstance Abuse - Patient smokes cigars, marijuana, and drinks ~1 pint liquor daily. Will require further cessation counseling.   Hyperbilirubinemia - Further outpatient workup indicated.  LUE Musculoskeletal Pain - Endorsed dull, diffuse pain of LUE that was resistant to Tylenol but resolved with Flexeril. Please assess.  2.  Labs / imaging needed at time of follow-up: CMP, consider RUQ ultrasound if bilirubin abnormal and insulin studies if patient has any symptomatic hypoglycemia.   3.  Pending labs/ test needing follow-up: None   Follow-up Appointments:  Follow-up Information    AElouise Munroe MD. Go on 06/03/2020.   Specialties: Cardiology, Radiology Why: @9 :40am for hospital follow up.  please keep BP record for discussion  Contact information: [address] [place] [phone]        LLajuana Matte MD Follow up.   Specialty: Cardiothoracic Surgery Why: There office will call with appoitnment. If you did not heard by next week. Please give a call  Contact information: [address] [place] [id number]: 1. Acute Pericarditis: Patient presented with 2 days of pleuritic CP. Troponins 53 > 34 . Inflammatory markers minimally elevated. EKG x 2 showed diffuse ST elevations and PR depressions. CXR unremarkable. ECHO showed no regional wall motion abnormalities or  pericardial effusion. New onset without known risk factors. Pleuritic CP improved with ibuprofen 870m TID and colchicine 0.647mtwice daily, continued on discharge with outpatient follow up with Dr. LiKipp Brood 2. 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 However, NM stress test and CT FFR negative for ischemic concerns. Surgery not indicated during admission, although patient will require follow up with Dr. LiKipp Broodutpatient.   3. New-Onset HFmrEF  ECHO showed mild LVH with Grade I Diastolic Dysfunction, EF 5502-33%and trivial mitral valve regurgitation. However, NM stress test showed EF estimated at 46%, without regional wall motion abnormalities. Patient did have hypertensive emergency on admission which could cause this as well as heavy alcohol use. Coronary CT negative for calcifications. No priors for comparison. Patient denies LE swelling, orthopnea, weight gain. Patient started on carvedilol at discharge.    4. Hyperlipidemia  LDL elevated to 105, although remainder of cholesterol panel WNL; ASCVD risk 5.6%. Held off on statin initiation. Would benefit from discussion regarding lifestyle modifications.   5. Hypertension Blood pressure has normalized on amlodipine and carvedilol, continued at higher dosages on discharge.    6. Polysubstance Abuse  Patient smokes cigars and marijuana and drinks 1 pint liquor alcohol daily. Denied history of withdrawal or current withdrawal symptoms. UDS positive for THC only. Would benefit greatly from cessation counseling.   7. Hyperbilirubinemia Patient was incidentally found to have elevated total bilirubin. May be related to alcoholic hepatitis although etiology unclear. Would benefit from further outpatient workup. Asymptomatic during admission.   8. LUE Pain Endorsed Dull, diffuse LUE pain. Denied numbness / radiculopathy. Likely musculoskeletal in origin after lifting grandchild. Did not improve with Tylenol but resolved with Flexeril. Given short course Flexeril on discharge.   Discharge Vitals:   BP 121/88 (BP Location: Left Arm)   Pulse 75   Temp 99.1 F (37.3 C)  (Oral)   Resp 16   Ht 5' 11"  (1.803 m)   Wt 99.8 kg   SpO2 100%   BMI 30.68 kg/m   Pertinent Labs, Studies, and Procedures:   Troponin 53 > 34 CRP - 4.5 / ESR - 2 Lipids - LDL 105 Hemoglobin A1c - 5.5  T. Bili 1.7  EKG - diffuse ST segment elevations and PR depressions (x2)   ECHO Complete 05/09/20:  1. Left ventricular ejection fraction, by estimation, is 55 to 60%. The  left ventricle has normal function. The left ventricle has no regional  wall motion abnormalities. There is mild left ventricular hypertrophy.  Left ventricular diastolic parameters  are consistent with Grade I diastolic dysfunction (impaired relaxation).   2. Right ventricular systolic function is normal. The right ventricular  size is normal. Tricuspid regurgitation signal is inadequate for assessing  PA pressure.   3. The mitral valve is abnormal. Trivial mitral valve regurgitation.   4. The aortic valve is tricuspid. Aortic valve regurgitation is not  visualized.   5. The inferior vena cava is normal in size with greater than 50%  respiratory variability, suggesting right atrial pressure of 3 mmHg.  CTA Coronary Morphology 05/10/20: IMPRESSION: 1. 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 Ostium has slit-like appearance,  and there is mild systolic compression of the body of the left main coronary artery. Right dominant circulation. CT FFR will be performed and reported separately for ischemic evaluation.   2.  No evidence of CAD, CADRADS = 0.   3. Coronary calcium score of 0.  CT Coronary Fractional Flow Reserve 05/10/20: FINDINGS: FFRct analysis was performed on the original cardiac CT angiogram dataset. Diagrammatic representation of the FFRct analysis is provided in a separate PDF document in PACS. This dictation was created using the PDF document and an interactive 3D model of the results. 3D model is  not available in the EMR/PACS. Normal FFR range is >0.80. Indeterminate (grey) zone is 0.76-0.80.   1. Left Main: FFR = 0.96 2. LAD: Proximal FFR = 0.94, mid FFR = 0.87, Distal FFR = 0.78 3. Ramus intermedius = proximal 0.96, distal 0.94 4. LCX: Proximal FFR = 0.94, distal FFR = 0.92 5. RCA: Proximal FFR = 0.96, mid FFR =0.93, distal FFR = 0.91   IMPRESSION: 1. CT FFR analysis showed no significant stenosis. No ischemia noted at rest with anomalous left main coronary artery with interarterial course.   RECOMMENDATIONS: Consider exercise stress imaging to assess for ischemia.  NM Myocardium w/ WM 05/11/20:  Blood pressure demonstrated a normal response to exercise.  The study is normal.  This is a low risk study.  The left ventricular ejection fraction is mildly decreased (45-54%).   Normal resting and stress perfusion. No ischemia or infarction EF Estimated 46% but no RWMA and normal by TTE this admission   Discharge Instructions: Discharge Instructions    Call MD for:  difficulty breathing, headache or visual disturbances   Complete by: As directed    Call MD for:  extreme fatigue   Complete by: As directed    Call MD for:  persistant dizziness or light-headedness   Complete by: As directed    Call MD for:  persistant nausea and vomiting   Complete by: As directed    Call MD for:  severe uncontrolled pain   Complete by: As directed    Call MD for:  temperature >100.4   Complete by: As directed    Diet - low sodium heart healthy   Complete by: As directed    Discharge instructions   Complete by: As directed    Mr. Weinreb,   You were admitted to the hospital for acute pericarditis, which is inflammation of the lining of the heart. You are being treated with high dose Ibuprofen and Colchicine for this. Continue taking this medication daily till your follow up with Dr. Margaretann Loveless. The Ibuprofen is 1 tablet, three times per day. The Colchicine is 1 tablet, two times per day.    While here, we discovered that one of the arteries feeding your heart is in an abnormal location. You had a stress test to make sure your heart still gets the blood flow it needs, and these results were re-assuring. Due to this, you are stable for discharge at this time with close follow up with the cardiothoracic surgeon, Dr. Kipp Brood.   For you blood pressure, you were started on two new medications. Coreg, also called Carvedilol, is a beta-blocker; take 1 tablet, twice per day. Amlodipine is a calcium channel blocker; take 1 tablet daily.   Please make sure to cut down your daily alcohol use. Strive for no more than 2 servings of alcohol per day, and not everyday. In addition, cut out cigar products.    It was  a pleasure meeting you and your wife. Thank you for allowing [place] to participate in your care!   - Dr. Charleen Kirks   Increase activity slowly   Complete by: As directed       Signed: Jeralyn Bennett, MD 05/12/2020, 11:30 PM   Pager: [phone]

## 2020-05-11 NOTE — Progress Notes (Signed)
Kevin Combs presented for a exercise stress test today.  No immediate complications.  Stress imaging is pending at this time.   Preliminary EKG findings may be listed in the chart, but the stress test result will not be finalized until perfusion imaging is complete.  Manson Passey, [place]  05/11/2020 12:32 PM

## 2020-05-11 NOTE — Progress Notes (Signed)
Pt returned to room via wheelchair by transporter. Pt appears in no distress

## 2020-05-16 ENCOUNTER — Telehealth: Payer: Self-pay | Admitting: General Practice

## 2020-05-16 NOTE — Telephone Encounter (Signed)
TOC HFU 05/27/2020 at 10:45 am with Dr. Kirke Corin.  Appt date and time confirmed with patient.

## 2020-05-16 NOTE — Telephone Encounter (Signed)
-----   Message from Albertha Ghee, MD sent at 05/10/2020  4:06 PM EST ----- Regarding: New patient Please make hospital follow up for this patient. He has pericarditis.  He is new and could be established with our clinic.

## 2020-05-18 ENCOUNTER — Encounter: Payer: Self-pay | Admitting: Thoracic Surgery (Cardiothoracic Vascular Surgery)

## 2020-05-18 ENCOUNTER — Ambulatory Visit: Payer: 59 | Admitting: Thoracic Surgery (Cardiothoracic Vascular Surgery)

## 2020-05-18 ENCOUNTER — Other Ambulatory Visit: Payer: Self-pay

## 2020-05-18 VITALS — BP 133/89 | HR 63 | Resp 20 | Ht 71.0 in | Wt 215.0 lb

## 2020-05-18 DIAGNOSIS — Q245 Malformation of coronary vessels: Secondary | ICD-10-CM

## 2020-05-18 NOTE — Progress Notes (Signed)
[address]       [place] [address]             [phone]                    CARLY APPLEGATE [facility] Medical Record #[id number] Date of Birth: 04/18/1976  Referring: Parke Poisson, MD Primary Care: Patient, No Pcp Per Primary Cardiologist: Parke Poisson, MD  Chief Complaint:    Chief Complaint  Patient presents with  . Follow-up    F/u from hospital consult for anomalous left coronary artery     History of Present Illness:    WM SAHAGUN 44 y.o. male presents in follow-up after hospital consultation.  He originally presented with left arm and shoulder pain, and diffuse ST elevations concerning for pericarditis, and an elevated troponin.  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  Prior to being discharged he underwent a stress test which was negative for any ischemia, and he has a very active lifestyle.  Complete football in college currently works for [employer] and [employer].  He currently is asymptomatic.  He denies any history of anginal symptoms.  He does occasionally have some indigestion with red meat.    No past medical history on file.  Past Surgical History:  Procedure Laterality Date  . KNEE SURGERY Right 1992  . LAPAROSCOPIC APPENDECTOMY N/A 01/28/2015   Procedure: APPENDECTOMY LAPAROSCOPIC;  Surgeon: Claud Kelp, MD;  Location: [facility];  Service: General;  Laterality: N/A;    Family History  Problem Relation Age of Onset  . Hypertension Mother   . Crohn's disease Daughter      Social History   Tobacco Use  Smoking Status Former Smoker  . Types: Cigars  Smokeless Tobacco Never Used    Social History   Substance and Sexual Activity  Alcohol Use Yes   Comment: social     No Known Allergies  Current Outpatient Medications  Medication Sig Dispense Refill  . acetaminophen (TYLENOL) 500 MG tablet Take 1,000 mg by mouth every 6 (six) hours as  needed (for pain).    Marland Kitchen amLODipine (NORVASC) 10 MG tablet Take 1 tablet (10 mg total) by mouth daily. 30 tablet 0  . carvedilol (COREG) 12.5 MG tablet Take 1 tablet (12.5 mg total) by mouth 2 (two) times daily with a meal. 60 tablet 0  . colchicine 0.6 MG tablet Take 1 tablet (0.6 mg total) by mouth 2 (two) times daily. 28 tablet 0  . cyclobenzaprine (FLEXERIL) 5 MG tablet Take 1 tablet (5 mg total) by mouth 3 (three) times daily as needed for muscle spasms (shoulder pain). 30 tablet 0  . ibuprofen (ADVIL) 800 MG tablet Take 1 tablet (800 mg total) by mouth 3 (three) times daily. 42 tablet 0   No current facility-administered medications for this visit.    Review of Systems  Gastrointestinal: Positive for heartburn.  Musculoskeletal: Positive for joint pain and myalgias.  All other systems reviewed and are negative.   PHYSICAL EXAMINATION: BP 133/89 (BP Location: Left Arm, Patient Position: Sitting)   Pulse 63   Resp 20   Ht 5\' 11"  (1.803 m)   Wt 215 lb (97.5 kg)   SpO2 98% Comment: RA with mask on  BMI 29.99 kg/m   Physical Exam Constitutional:      General: He is not in acute distress.    Appearance:  Normal appearance. He is normal weight. He is not ill-appearing.  HENT:     Head: Normocephalic and atraumatic.  Eyes:     Extraocular Movements: Extraocular movements intact.  Cardiovascular:     Rate and Rhythm: Normal rate.  Pulmonary:     Effort: Pulmonary effort is normal. No respiratory distress.  Abdominal:     General: Abdomen is flat.  Musculoskeletal:        General: Normal range of motion.     Cervical back: Normal range of motion.  Skin:    General: Skin is warm and dry.  Neurological:     General: No focal deficit present.     Mental Status: He is alert and oriented to person, place, and time.      Diagnostic Studies & Laboratory data:     Recent Radiology Findings:   DG Chest 2 View  Result Date: 05/09/2020 CLINICAL DATA:  Left arm pain which began  05/07/2020 has migrated into the chest. EXAM: CHEST - 2 VIEW COMPARISON:  PA and lateral chest 02/02/2013. FINDINGS: Lungs clear. Heart size normal. No pneumothorax or pleural fluid. No bony abnormality. IMPRESSION: Normal chest. Electronically Signed   By: Drusilla Kanner M.D.   On: 05/09/2020 11:44   NM Myocar Multi W/Spect W/Wall Motion / EF  Result Date: 05/11/2020  Blood pressure demonstrated a normal response to exercise.  The study is normal.  This is a low risk study.  The left ventricular ejection fraction is mildly decreased (45-54%).  Normal resting and stress perfusion. No ischemia or infarction EF Estimated 46% but no RWMA and normal by TTE this admission   CT CORONARY MORPH W/CTA COR W/SCORE W/CA W/CM &/OR WO/CM  Addendum Date: 05/10/2020   ADDENDUM REPORT: 05/10/2020 15:50 HISTORY: Chest pain/anginal equiv, ECGs or troponins abnormal EXAM: Cardiac/Coronary  CT TECHNIQUE: The patient was scanned on a [employer]. PROTOCOL: A 120 kV prospective scan was triggered in the descending thoracic aorta at 111 HU's. Axial non-contrast 3 mm slices were carried out through the heart. The data set was analyzed on a dedicated work station and scored using the Agatson method. Gantry rotation speed was 250 msecs and collimation was .6 mm. Beta blockade and 0.8 mg of sl NTG was given. The 3D data set was reconstructed in 5% intervals of the 67-82 % of the R-R cycle. Diastolic phases were analyzed on a dedicated work station using MPR, MIP and VRT modes. The patient received 71mL OMNIPAQUE IOHEXOL 350 MG/ML SOLN of contrast. FINDINGS: Image quality: Good. Noise artifact is: Limited. Coronary calcium score is 0. Coronary arteries: Right dominance. 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 It takes an interarterial course between the aorta and pulmonary artery. Ostium is slit like appearance, and the body of the left main coronary artery may be  partially intramural. There is mild systolic compression of the left main coronary artery. The left main then trifurcates into the LAD, Ramus intermedius, and L circumflex arteries. The RCA has a normal origin and course, and is dominant. Right Coronary Artery: No detectable plaque or stenosis. Left Main Coronary Artery: No detectable plaque or stenosis. Left Anterior Descending Coronary Artery: No detectable plaque or stenosis. Ramus intermedius: No detectable plaque or stenosis. Left Circumflex Artery: No detectable plaque or stenosis. Aorta: Normal size, 35 mm at the mid ascending aorta (level of the PA bifurcation) measured double oblique. No calcifications. No dissection. Aortic Valve: No calcifications. Other  findings: Normal pulmonary vein drainage into the left atrium. Normal left atrial appendage without a thrombus. Normal caliber main pulmonary artery. IMPRESSION: 1. 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 Ostium has slit-like appearance, and there is mild systolic compression of the body of the left main coronary artery. Right dominant circulation. CT FFR will be performed and reported separately for ischemic evaluation. 2.  No evidence of CAD, CADRADS = 0. 3. Coronary calcium score of 0. Electronically Signed   By: Weston BrassGayatri  Acharya   On: 05/10/2020 15:50   Result Date: 05/10/2020 EXAM: OVER-READ INTERPRETATION  CT CHEST The following report is an over-read performed by radiologist Dr. Jeronimo [facility]avesKyle Talbot [facility] on 05/10/2020. This over-read does not include interpretation of cardiac or coronary anatomy or pathology. The coronary CTA interpretation by the cardiologist is attached. COMPARISON:  Chest radiograph 1 day prior. FINDINGS: Vascular: Normal aortic caliber. No central pulmonary embolism, on this non-dedicated study. Mediastinum/Nodes: No imaged thoracic adenopathy. Lungs/Pleura: Trace left  pleural fluid. Left base subsegmental atelectasis. Upper Abdomen: Normal imaged portions of the liver, spleen, stomach. Musculoskeletal: No acute osseous abnormality. IMPRESSION: Trace left pleural fluid and left base subsegmental atelectasis. Although no pulmonary embolism is identified on this nondedicated study, pleural fluid and atelectasis can be sitting in the setting of pulmonary emboli. If left-sided pulmonary embolism is a clinical concern, consider dedicated CTA of the chest. Electronically Signed: By: Jeronimo GreavesKyle  Talbot M.D. On: 05/10/2020 12:59   CT CORONARY FRACTIONAL FLOW RESERVE DATA PREP  Result Date: 05/11/2020 EXAM: CT FFR ANALYSIS CLINICAL DATA:  Anomalous left main coronary artery with interarterial course. FINDINGS: FFRct analysis was performed on the original cardiac CT angiogram dataset. Diagrammatic representation of the FFRct analysis is provided in a separate PDF document in PACS. This dictation was created using the PDF document and an interactive 3D model of the results. 3D model is not available in the EMR/PACS. Normal FFR range is >0.80. Indeterminate (grey) zone is 0.76-0.80. 1. Left Main: FFR = 0.96 2. LAD: Proximal FFR = 0.94, mid FFR = 0.87, Distal FFR = 0.78 3. Ramus intermedius = proximal 0.96, distal 0.94 4. LCX: Proximal FFR = 0.94, distal FFR = 0.92 5. RCA: Proximal FFR = 0.96, mid FFR =0.93, distal FFR = 0.91 IMPRESSION: 1. CT FFR analysis showed no significant stenosis. No ischemia noted at rest with anomalous left main coronary artery with interarterial course. RECOMMENDATIONS: Consider exercise stress imaging to assess for ischemia. Electronically Signed   By: Weston BrassGayatri  Acharya   On: 05/11/2020 13:06   ECHOCARDIOGRAM COMPLETE  Result Date: 05/09/2020    ECHOCARDIOGRAM REPORT   Patient Name:   Rudy JewJAMES A Venhuizen Date of Exam: 05/09/2020 Medical Rec #:  [id number]     Height:       71.0 in Accession #:    [id number]    Weight:       220.0 lb Date of Birth:  21-Apr-1976     BSA:           2.196 m Patient Age:    44 years      BP:           143/102 mmHg Patient Gender: M             HR:           88 bpm. Exam Location:  Inpatient Procedure: 2D Echo, Cardiac Doppler and Color Doppler  STAT ECHO Reported to: Dr Italy Hilty on 05/09/2020 12:16:00 PM. Indications:    STEMI I21.3  History:        Patient has no prior history of Echocardiogram examinations.  Sonographer:    Elmarie Shiley Dance Referring Phys: 95 MICHAEL COOPER IMPRESSIONS  1. Left ventricular ejection fraction, by estimation, is 55 to 60%. The left ventricle has normal function. The left ventricle has no regional wall motion abnormalities. There is mild left ventricular hypertrophy. Left ventricular diastolic parameters are consistent with Grade I diastolic dysfunction (impaired relaxation).  2. Right ventricular systolic function is normal. The right ventricular size is normal. Tricuspid regurgitation signal is inadequate for assessing PA pressure.  3. The mitral valve is abnormal. Trivial mitral valve regurgitation.  4. The aortic valve is tricuspid. Aortic valve regurgitation is not visualized.  5. The inferior vena cava is normal in size with greater than 50% respiratory variability, suggesting right atrial pressure of 3 mmHg. Comparison(s): No prior Echocardiogram. Conclusion(s)/Recommendation(s): Critical findings reported to Dr. Excell Seltzer and acknowledged at 12:24 pm on 05/09/20. FINDINGS  Left Ventricle: Left ventricular ejection fraction, by estimation, is 55 to 60%. The left ventricle has normal function. The left ventricle has no regional wall motion abnormalities. The left ventricular internal cavity size was normal in size. There is  mild left ventricular hypertrophy. Left ventricular diastolic parameters are consistent with Grade I diastolic dysfunction (impaired relaxation). Normal left ventricular filling pressure. Right Ventricle: The right ventricular size is normal. No increase in right ventricular  wall thickness. Right ventricular systolic function is normal. Tricuspid regurgitation signal is inadequate for assessing PA pressure. Left Atrium: Left atrial size was normal in size. Right Atrium: Right atrial size was normal in size. Pericardium: There is no evidence of pericardial effusion. Mitral Valve: The mitral valve is abnormal. There is mild thickening of the mitral valve leaflet(s). Trivial mitral valve regurgitation. Tricuspid Valve: The tricuspid valve is grossly normal. Tricuspid valve regurgitation is trivial. Aortic Valve: The aortic valve is tricuspid. Aortic valve regurgitation is not visualized. Pulmonic Valve: The pulmonic valve was normal in structure. Pulmonic valve regurgitation is mild. Aorta: The aortic root and ascending aorta are structurally normal, with no evidence of dilitation. Venous: The inferior vena cava is normal in size with greater than 50% respiratory variability, suggesting right atrial pressure of 3 mmHg. IAS/Shunts: There is right bowing of the interatrial septum, suggestive of elevated left atrial pressure. No atrial level shunt detected by color flow Doppler.  LEFT VENTRICLE PLAX 2D LVIDd:         4.60 cm  Diastology LVIDs:         3.40 cm  LV e' medial:    8.38 cm/s LV PW:         1.30 cm  LV E/e' medial:  6.6 LV IVS:        1.30 cm  LV e' lateral:   10.10 cm/s LVOT diam:     2.50 cm  LV E/e' lateral: 5.5 LV SV:         90 LV SV Index:   41 LVOT Area:     4.91 cm  RIGHT VENTRICLE             IVC RV Basal diam:  3.30 cm     IVC diam: 1.70 cm RV Mid diam:    2.70 cm RV S prime:     12.10 cm/s TAPSE (M-mode): 1.6 cm LEFT ATRIUM  Index       RIGHT ATRIUM           Index LA diam:        4.30 cm 1.96 cm/m  RA Area:     14.20 cm LA Vol (A2C):   57.9 ml 26.37 ml/m RA Volume:   31.80 ml  14.48 ml/m LA Vol (A4C):   42.4 ml 19.31 ml/m LA Biplane Vol: 50.2 ml 22.86 ml/m  AORTIC VALVE LVOT Vmax:   101.00 cm/s LVOT Vmean:  62.500 cm/s LVOT VTI:    0.184 m  AORTA Ao  Root diam: 3.50 cm Ao Asc diam:  3.70 cm MITRAL VALVE MV Area (PHT): 2.73 cm    SHUNTS MV Decel Time: 278 msec    Systemic VTI:  0.18 m MV E velocity: 55.30 cm/s  Systemic Diam: 2.50 cm MV A velocity: 66.00 cm/s MV E/A ratio:  0.84 Zoila Shutter MD Electronically signed by Zoila Shutter MD Signature Date/Time: 05/09/2020/12:26:30 PM    Final        I have independently reviewed the above radiology studies  and reviewed the findings with the patient.   Recent Lab Findings: Lab Results  Component Value Date   WBC 6.4 05/10/2020   HGB 14.8 05/10/2020   HCT 42.8 05/10/2020   PLT 186 05/10/2020   GLUCOSE 81 05/11/2020   CHOL 191 05/10/2020   TRIG 52 05/10/2020   HDL 76 05/10/2020   LDLCALC 105 (H) 05/10/2020   ALT 30 05/11/2020   AST 27 05/11/2020   NA 137 05/11/2020   K 3.6 05/11/2020   CL 101 05/11/2020   CREATININE 1.01 05/11/2020   BUN 17 05/11/2020   CO2 23 05/11/2020   HGBA1C 5.5 05/09/2020        Assessment / Plan:   44 year old male with coronary artery that presents for surgical evaluation.  In clinic today was happy to speak with his wife, and his mother and sister via FaceTime.  We discussed the risks and benefits of proceeding with surgical correction of this anomalous left coronary.  We discussed the option of a coronary unroofing, versus reimplantation, versus coronary artery bypass grafting.  We also discussed the risks of medically managing this discovery which included ongoing anginal symptoms, arrhythmias, and sudden death.  Even though he is asymptomatic and given the course of the left coronary artery he is at much higher risk of sudden cardiac death.  I recommended that he undergo coronary unroofing, and he is 90% certain that he would proceed with the surgery but would like to get some financial concerns in order.  Warning signs for ongoing management of atypical chest pain were also discussed and he was instructed to come immediately to the emergency department  if he has any other symptoms.    I spent approximately 45 minutes, for details of his condition and outlined in treatment strategy.  [name] 05/18/2020 1:01 PM

## 2020-05-25 NOTE — Telephone Encounter (Signed)
Transition Care Management Follow-up Telephone Call  Date of discharge and from where: Discharged 05/11/20 to his home.  How have you been since you were released from the hospital? Stated he has been "doing well".  Any questions or concerns? No.  Items Reviewed:  Did the pt receive and understand the discharge instructions provided? Yes ; no questions.  Medications obtained and verified? Yes      Any new allergies since your discharge? States NKA  Dietary orders reviewed? Yes, stated he's on a heart healthy diet; no tobacco products.  Do you have support at home? Lives with his wife and son.  Home Care and Equipment/Supplies: Were home health services ordered? No.  Functional Questionnaire: (I = Independent and D = Dependent) Stated he's completely independent of all ADL's.  Follow up appointments reviewed:   PCP Hospital f/u appt confirmed? Yes  Scheduled to see Dr Kirke Corin on Friday 05/27/20 @ 1045 AM.   Are transportation arrangements needed? No.  If their condition worsens, is the pt aware to call PCP or go to the Emergency Dept.? Yes.  Was the patient provided with contact information for the PCP's office or ED? Yes.  Was to pt encouraged to call back with questions or concerns? Yes.

## 2020-05-26 ENCOUNTER — Telehealth: Payer: 59 | Admitting: Internal Medicine

## 2020-05-27 ENCOUNTER — Encounter: Payer: Self-pay | Admitting: Student

## 2020-05-27 ENCOUNTER — Ambulatory Visit: Payer: 59 | Admitting: Student

## 2020-05-27 DIAGNOSIS — I300 Acute nonspecific idiopathic pericarditis: Secondary | ICD-10-CM | POA: Diagnosis not present

## 2020-05-27 DIAGNOSIS — I10 Essential (primary) hypertension: Secondary | ICD-10-CM | POA: Insufficient documentation

## 2020-05-27 DIAGNOSIS — Q245 Malformation of coronary vessels: Secondary | ICD-10-CM | POA: Diagnosis not present

## 2020-05-27 NOTE — Progress Notes (Signed)
   CC: Hospital follow-up  HPI:  Kevin Combs is a 45 y.o. male with a history of an anomalous left coronary artery, recently diagnosed diastolic heart failure, and hypertension who presents for hospital follow-up. Please see problem based charting for evaluation, assessment and plan.   Past Medical History:  Diagnosis Date  . 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 HTN (hypertension)   . Pericarditis 05/09/2020   Review of Systems:  Constitutional: Negative for fevers or chills Respiratory: Negative for shortness of breath Cardiac: Negative for chest pain, palpitations or LE edema MSK: Negative for leg pain Psych: Positive for anxiety due to upcoming heart surgery  Physical Exam: General: Pleasant young man. No acute distress.  Cardiac: RRR. No murmurs, rubs or gallops.  No LE edema Respiratory: Lungs CTAB. No wheezing or crackles.  MSK: Normal ROM. No TTP of anterior chest wall. Abdominal: Soft, symmetric and non tender.  Skin: Warm, dry and intact without rashes or lesions Neuro: A&O x 3. Moves all extremities Psych: Anxious mood  Vitals:   05/27/20 1059  BP: 132/88  Pulse: 64  Temp: 98.2 F (36.8 C)  TempSrc: Oral  SpO2: 100%  Weight: 218 lb 4.8 oz (99 kg)  Height: 5\' 11"  (1.803 m)     Assessment & Plan:   See Encounters Tab for problem based charting.  Patient discussed with Dr. , MD, MPH

## 2020-05-27 NOTE — Assessment & Plan Note (Signed)
Patient's BP is well controlled. Current BP is 132/88. Patient denies any headaches or blurry vision. Currently on amlodipine and Coreg for BP control. --Continue amlodipine 10 mg daily --Continue Coreg 12.5 mg twice daily --BMP at PCP follow-up on January 10th

## 2020-05-27 NOTE — Patient Instructions (Addendum)
Thank you, Mr.Glennon A Vernier for allowing [place] to provide your care today. Today we discussed your chest pain.  Since this has improved, continue taking your colchicine and ibuprofen until you finish the course.  Please follow-up with your primary care doctor.   Please follow-up with Dr. Carmelia Roller.  Should you have any questions or concerns please call the internal medicine clinic at [phone].    Sharrell Ku, MD, MPH [facility]   My Chart Access: [email or web address]   If you have not already done so, please get your COVID 19 vaccine  To schedule an appointment for a COVID vaccine choice any of the following: Go to [email or web address]   Go to [email or web address]                  Call [phone]                                     Call [phone] and select Option 2

## 2020-05-27 NOTE — Assessment & Plan Note (Addendum)
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 Patient was evaluated by Dr. Cliffton Asters and while patient is asymptomatic, he recommended he undergo coronary unroofing due to his much higher risk of sudden cardiac death. Patient states he is nervous about having an open heart surgery but he plans to schedule the surgery appointment in February once he has had time to digest the information and get off over his anxiety. Patient denies any chest pain, lightheadedness or shortness of breath. --Continue to follow-up with Dr. Cliffton Asters --Scheduled to follow-up with PCP on January 10th

## 2020-05-27 NOTE — Assessment & Plan Note (Addendum)
Patient was hospitalized with chest pain on 12/20 and found to have acute pericarditis. Patient was discharged home on 12/22 with a 2-week course of colchicine and ibuprofen. Patient states his chest pain has resolved. States he is only taking ibuprofen sometimes but not every day. Advised patient to finish course of colchicine follow-up with PCP. --Continue colchicine 0.6 mg twice daily --Continue Advil 800 mg 3 times daily --Follow-up with PCP Dr. Carmelia Roller at family medicine

## 2020-05-30 ENCOUNTER — Encounter: Payer: Self-pay | Admitting: *Deleted

## 2020-05-30 ENCOUNTER — Ambulatory Visit: Payer: 59 | Admitting: Family Medicine

## 2020-05-30 ENCOUNTER — Encounter: Payer: Self-pay | Admitting: Family Medicine

## 2020-05-30 ENCOUNTER — Other Ambulatory Visit: Payer: Self-pay | Admitting: *Deleted

## 2020-05-30 ENCOUNTER — Other Ambulatory Visit: Payer: Self-pay

## 2020-05-30 VITALS — BP 120/78 | HR 61 | Temp 98.3°F | Ht 71.0 in | Wt 215.0 lb

## 2020-05-30 DIAGNOSIS — Q245 Malformation of coronary vessels: Secondary | ICD-10-CM

## 2020-05-30 DIAGNOSIS — F418 Other specified anxiety disorders: Secondary | ICD-10-CM

## 2020-05-30 MED ORDER — HYDROXYZINE HCL 25 MG PO TABS: 25.0000 mg | ORAL_TABLET | Freq: Three times a day (TID) | ORAL | 1 refills | Status: DC | PRN

## 2020-05-30 NOTE — Patient Instructions (Signed)
Coping skills Choose 5 that work for you:  Take a deep breath  Count to 20  Read a book  Do a puzzle  Meditate  Bake  Sing  Knit  Garden  Pray  Go outside  Call a friend  Listen to music  Take a walk  Color  Send a note  Take a bath  Watch a movie  Be alone in a quiet place  Pet an animal  Visit a friend  Journal  Exercise  Stretch   Please consider counseling. Contact [phone] to schedule an appointment or inquire about cost/insurance coverage.  Let us know if you need anything.  

## 2020-05-30 NOTE — Progress Notes (Signed)
Chief Complaint  Patient presents with  . Follow-up    Subjective: Patient is a 45 y.o. male here for follow-up.  Kevin Combs was admitted to the hospital for pericarditis and diagnosed with anomalous left coronary artery return.  He is scheduled for fixation of this with Dr. Cliffton Asters of the cardiothoracic team on 2/16.  He is quite anxious for this.  He is requesting something to help with anxiety on an as-needed basis.  He has been physically taking it easy and has had no chest pain since then.  He was prescribed colchicine and as needed indomethacin for the pericarditis.  He is tolerating all of his medicines well.  Past Medical History:  Diagnosis Date  . 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 HTN (hypertension)   . Pericarditis 05/09/2020    Objective: BP 120/78 (BP Location: Left Arm, Patient Position: Sitting, Cuff Size: Normal)   Pulse 61   Temp 98.3 F (36.8 C) (Oral)   Ht 5\' 11"  (1.803 m)   Wt 215 lb (97.5 kg)   SpO2 100%   BMI 29.99 kg/m  General: Awake, appears stated age HEENT: MMM, EOMi Heart: RRR Lungs: CTAB, no rales, wheezes or rhonchi. No accessory muscle use Psych: Age appropriate judgment and insight, normal affect and mood  Assessment and Plan: Anomalous left coronary artery  Situational anxiety  1.  Appreciate the cardiothoracic team.  He will let me know if he desires a second opinion, though I did voice my confidence in Dr. . 2. we will add hydroxyzine as needed.  He will let Cliffton Asters know in a couple weeks if he would like to try something else.  We will consider gabapentin versus Klonopin.  He declines a daily medication.  Anxiety coping techniques verbalized and written down.  He was also provided with the behavioral team contact information. The patient voiced understanding and agreement to the plan.  [place], DO 05/30/20  2:25 PM

## 2020-05-30 NOTE — Progress Notes (Signed)
Internal Medicine Clinic Attending  Case discussed with Dr. Amponsah  At the time of the visit.  We reviewed the resident's history and exam and pertinent patient test results.  I agree with the assessment, diagnosis, and plan of care documented in the resident's note.  

## 2020-05-30 NOTE — Progress Notes (Deleted)
Chief Complaint  Patient presents with  . Follow-up    Subjective: Patient is a 45 y.o. male here for ***.    Past Medical History:  Diagnosis Date  . 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 HTN (hypertension)   . Pericarditis 05/09/2020    Objective: BP 120/78 (BP Location: Left Arm, Patient Position: Sitting, Cuff Size: Normal)   Pulse 61   Temp 98.3 F (36.8 C) (Oral)   Ht 5\' 11"  (1.803 m)   Wt 215 lb (97.5 kg)   SpO2 100%   BMI 29.99 kg/m  General: Awake, appears stated age HEENT: MMM, EOMi Heart: RRR, no murmurs Lungs: CTAB, no rales, wheezes or rhonchi. No accessory muscle use Psych: Age appropriate judgment and insight, normal affect and mood  Assessment and Plan: Anomalous left coronary artery  Situational anxiety  Orders as above. The patient voiced understanding and agreement to the plan.  [place], DO 05/30/20  1:35 PM

## 2020-06-03 ENCOUNTER — Encounter: Payer: Self-pay | Admitting: Internal Medicine

## 2020-06-03 ENCOUNTER — Ambulatory Visit: Payer: 59 | Admitting: Internal Medicine

## 2020-06-03 ENCOUNTER — Other Ambulatory Visit: Payer: Self-pay

## 2020-06-03 VITALS — BP 114/82 | HR 55 | Ht 71.0 in | Wt 220.0 lb

## 2020-06-03 DIAGNOSIS — Q245 Malformation of coronary vessels: Secondary | ICD-10-CM | POA: Diagnosis not present

## 2020-06-03 DIAGNOSIS — I10 Essential (primary) hypertension: Secondary | ICD-10-CM | POA: Diagnosis not present

## 2020-06-03 DIAGNOSIS — I300 Acute nonspecific idiopathic pericarditis: Secondary | ICD-10-CM | POA: Diagnosis not present

## 2020-06-03 NOTE — Progress Notes (Signed)
Cardiology Office Note:    Date:  06/03/2020   ID:  Kevin Combs, DOB 07-31-1975, MRN [id number]  PCP:  [name], DO  Cardiologist:  [name], MD  Electrophysiologist:  None   Referring MD: No ref. 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 male with recent admission to the hospital for left arm pain.  He was noted to have an abnormal EKG with diffuse ST elevations, and hypertensive urgency.  Code STEMI was initially called, however Dr. Burt Knack reviewed and felt not representative of STEMI.  ESR and CRP were minimally elevated and ECG showed diffuse ST elevations, concerning for pericarditis.  Patient started on treatment for pericarditis with ibuprofen and colchicine.  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  CT FFR performed with no flow limitation.  Exercise stress treadmill test performed as well with nuclear imaging showing no ischemic ECG changes (ECG was somewhat uninterpretable due to baseline ECG changes), and no perfusion abnormalities on nuclear imaging.  He was seen in consultation by cardiac surgery.  We determined that he was overall low risk, and surgical consultation was performed as an outpatient.  Patient has been arranged for unroofing of left main coronary artery on 07/06/2020 with Dr. Melodie Bouillon.  Blood pressure has been well controlled at home on amlodipine 10 mg daily.  He is also on carvedilol 12.5 mg twice daily but has only been taking it once a day when he remembers.  We discussed that this medication is optimally taken twice a  day given mechanism of action, and he will try to take it twice a day.  I anticipate he may have medication changes after surgery during his hospital stay.  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  He has had no recurrent chest pain and is following with his primary care doctor Dr. Nani Ravens.  He continues to take colchicine 0.6 mg twice daily without issue.  He is not taking ibuprofen.  Past Medical History:  Diagnosis Date  . 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 HTN (hypertension)   . Pericarditis 05/09/2020    Past Surgical History:  Procedure Laterality Date  . KNEE SURGERY Right 1992  . LAPAROSCOPIC APPENDECTOMY N/A 01/28/2015   Procedure: APPENDECTOMY LAPAROSCOPIC;  Surgeon: [name], MD;  Location: [facility];  Service: General;  Laterality: N/A;    Current Medications: No outpatient medications have been marked as taking for the 06/03/20 encounter (Office Visit) with [name], MD.     Allergies:   Patient has no known allergies.   Social History   Tobacco Use  . Smoking status: Former Smoker    Types: Cigars  . Smokeless tobacco: Never Used  Vaping Use  . Vaping Use: Never used  Substance Use Topics  . Alcohol use: Yes    Comment: social  . Drug use: Yes    Types: Marijuana     Family History: The patient's family history includes Crohn's disease in his daughter; Hypertension in his mother.  ROS:   Please see the history  of present illness.    All other systems reviewed and are negative.  EKGs/Labs/Other Studies Reviewed:    The following studies were reviewed today:  EKG: Sinus bradycardia rate 55, left ventricular hypertrophy, early repolarization pattern  Recent Labs: 05/10/2020: Hemoglobin 14.8; Platelets 186 05/11/2020: ALT 30; BUN 17; Creatinine, Ser 1.01; Potassium 3.6; Sodium 137  Recent Lipid Panel    Component Value Date/Time   CHOL 191 05/10/2020 0300   TRIG  52 05/10/2020 0300   HDL 76 05/10/2020 0300   CHOLHDL 2.5 05/10/2020 0300   VLDL 10 05/10/2020 0300   LDLCALC 105 (H) 05/10/2020 0300    Physical Exam:    VS:  BP 114/82 (BP Location: Left Arm, Patient Position: Sitting)   Pulse (!) 55   Ht 5' 11"  (1.803 m)   Wt 220 lb (99.8 kg)   BMI 30.68 kg/m     Wt Readings from Last 5 Encounters:  06/03/20 220 lb (99.8 kg)  05/30/20 215 lb (97.5 kg)  05/27/20 218 lb 4.8 oz (99 kg)  05/18/20 215 lb (97.5 kg)  05/09/20 220 lb (99.8 kg)    Constitutional: No acute distress Eyes: sclera non-icteric, normal conjunctiva and lids ENMT: normal dentition, moist mucous membranes Cardiovascular: regular rhythm, normal rate, no murmurs. S1 and S2 normal. Radial pulses normal bilaterally. No jugular venous distention.  Respiratory: clear to auscultation bilaterally GI : normal bowel sounds, soft and nontender. No distention.   MSK: extremities warm, well perfused. No edema.  NEURO: grossly nonfocal exam, moves all extremities. PSYCH: alert and oriented x 3, normal mood and affect.   ASSESSMENT:    1. Anomalous left coronary artery   2. Hypertension, unspecified type   3. Acute idiopathic pericarditis    PLAN:    Anomalous left coronary artery -He has a surgical date set in February with Dr. Melodie Bouillon.  We will follow closely and as needed in the hospital postoperatively.  Hypertension, unspecified type - Plan: EKG 12-Lead -Continue amlodipine.  Encourage carvedilol 12.5 mg twice daily, if he experiences bradycardia or hypotension we can decrease the dose of this medication as needed.  Acute idiopathic pericarditis -It is difficult to know if he truly had pericarditis, his ST segment is still elevated on ECG today suggesting he may have early repolarization pattern more so than pericarditis.  Nevertheless he is chest pain-free and tolerating colchicine well.  We will continue colchicine through his surgical date, at which point it can  likely be stopped if no further indication for surgery.  He is not taking ibuprofen, but is nonsymptomatic and this is reasonable.  Plan for follow-up 2 to 3 months postoperatively.  Total time of encounter: 30 minutes total time of encounter, including 20 minutes spent in face-to-face patient care on the date of this encounter. This time includes coordination of care and counseling regarding above mentioned problem list. Remainder of non-face-to-face time involved reviewing chart documents/testing relevant to the patient encounter and documentation in the medical record. I have independently reviewed documentation from referring provider.   Cherlynn Kaiser, MD [place]  [facility]    Medication Adjustments/Labs and Tests Ordered: Current medicines are reviewed at length with the patient today.  Concerns regarding medicines are outlined above.   Orders Placed This Encounter  Procedures  . EKG 12-Lead     No orders of the defined types were placed in this encounter.   Patient Instructions  Medication Instructions:  No Changes In Medications at this time.  *If you need  a refill on your cardiac medications before your next appointment, please call your pharmacy*  Follow-Up: At [facility], you and your health needs are our priority.  As part of our continuing mission to provide you with exceptional heart care, we have created designated Provider Care Teams.  These Care Teams include your primary Cardiologist (physician) and Advanced Practice Providers (APPs -  Physician Assistants and Nurse Practitioners) who all work together to provide you with the care you need, when you need it.  We recommend signing up for the patient portal called "MyChart".  Sign up information is provided on this After Visit Summary.  MyChart is used to connect with patients for Virtual Visits (Telemedicine).  Patients are able to view lab/test results, encounter notes, upcoming appointments, etc.   Non-urgent messages can be sent to your provider as well.   To learn more about what you can do with MyChart, go to [email or web address].    Your next appointment:   Mid April   The format for your next appointment:   In Person  Provider:   Cherlynn Kaiser, MD

## 2020-06-03 NOTE — Patient Instructions (Signed)
Medication Instructions:  No Changes In Medications at this time.  *If you need a refill on your cardiac medications before your next appointment, please call your pharmacy*  Follow-Up: At [facility], you and your health needs are our priority.  As part of our continuing mission to provide you with exceptional heart care, we have created designated Provider Care Teams.  These Care Teams include your primary Cardiologist (physician) and Advanced Practice Providers (APPs -  Physician Assistants and Nurse Practitioners) who all work together to provide you with the care you need, when you need it.  We recommend signing up for the patient portal called "MyChart".  Sign up information is provided on this After Visit Summary.  MyChart is used to connect with patients for Virtual Visits (Telemedicine).  Patients are able to view lab/test results, encounter notes, upcoming appointments, etc.  Non-urgent messages can be sent to your provider as well.   To learn more about what you can do with MyChart, go to [email or web address].    Your next appointment:   Mid April   The format for your next appointment:   In Person  Provider:   Weston Brass, MD

## 2020-06-13 ENCOUNTER — Telehealth: Payer: Self-pay | Admitting: Internal Medicine

## 2020-06-13 NOTE — Telephone Encounter (Signed)
Patient states Dr. Jacques Navy provided him with a note for work stating what he can and cannot do. He states his work says the note is too vague and they are wanting something that is more specific - please call/advise   Thank you!

## 2020-06-13 NOTE — Telephone Encounter (Signed)
Spoke with patient who states that he has a note for work from Dr. Cliffton Asters but per patient his job is saying that it is too vague. Patient needs an in depth note stating what activities he can and can't do related to his job. Advised patient to bring in list of job duties/job description so that Dr. Jacques Navy can review it. Patient states that he will be by the office today to bring job duties list. Advised patient once letter is complete I will let him know. Patient verbalized understanding.

## 2020-06-13 NOTE — Telephone Encounter (Signed)
*  STAT* If patient is at the pharmacy, call can be transferred to refill team.   1. Which medications need to be refilled? (please list name of each medication and dose if known)  amLODipine (NORVASC) 10 MG tablet carvedilol (COREG) 12.5 MG tablet  2. Which pharmacy/location (including street and city if local pharmacy) is medication to be sent to? [id number] - , [place] - [address]  3. Do they need a 30 day or 90 day supply?  90 day   Patient ran out of medication yesterday, states [facility] says they could not get through to [place]

## 2020-06-14 MED ORDER — AMLODIPINE BESYLATE 10 MG PO TABS: 10.0000 mg | ORAL_TABLET | Freq: Every day | ORAL | 3 refills | Status: DC

## 2020-06-14 MED ORDER — CARVEDILOL 12.5 MG PO TABS: 12.5000 mg | ORAL_TABLET | Freq: Two times a day (BID) | ORAL | 3 refills | Status: DC

## 2020-06-14 NOTE — Telephone Encounter (Signed)
Rx has been sent to the pharmacy electronically. ° °

## 2020-06-14 NOTE — Telephone Encounter (Signed)
Spoke with patient and made him aware that letter is available at front desk for pick up. Patient will come by tomorrow to pick this up. Patient verbalized understanding.

## 2020-06-30 ENCOUNTER — Telehealth: Payer: Self-pay | Admitting: Internal Medicine

## 2020-06-30 ENCOUNTER — Encounter: Payer: Self-pay | Admitting: Internal Medicine

## 2020-06-30 NOTE — Telephone Encounter (Signed)
Dr.Lightfoots office will need to complete this paperwork per Dr.Acharya. Contacted patient and he will come by the office to pick up paperwork and payment to take to Dr.Lightfoots office.

## 2020-06-30 NOTE — Telephone Encounter (Signed)
[facility] received paperwork from [employer] [place] on 2/10, copy of paperwork was given to provider.

## 2020-07-01 ENCOUNTER — Telehealth: Payer: Self-pay

## 2020-07-01 NOTE — Telephone Encounter (Signed)
FMLA form completed and faxed to [employer] [place] [place] [phone]. Beginning leave 07/06/20 through 10/03/20. Original form mailed to home address.

## 2020-07-01 NOTE — Progress Notes (Signed)
Surgical Instructions    Your procedure is scheduled on Wednesday, July 06, 2020 from 8:30AM - 2:03PM .  Report to [facility] Main Entrance "A" at 6:30 A.M., then check in with the Admitting office.  Call this number if you have problems the morning of surgery:  [phone]   If you have any questions prior to your surgery date call [phone]: Open Monday-Friday 8am-4pm    Remember:  Do not eat or drink after midnight the night before your surgery    Take these medicines the morning of surgery with A SIP OF WATER:  amLODipine (NORVASC)  carvedilol (COREG)  acetaminophen (TYLENOL) - if needed cyclobenzaprine (FLEXERIL) - if needed  As of today, STOP taking any Aspirin (unless otherwise instructed by your surgeon) Aleve, Naproxen, Ibuprofen, Motrin, Advil, Goody's, BC's, all herbal medications, fish oil, and all vitamins.                     Do not wear jewelry.            Do not wear lotions, powders, colognes, or deodorant.            Do not shave 48 hours prior to surgery.  Men may shave face and neck.            Do not bring valuables to the hospital.            [facility] is not responsible for any belongings or valuables.  Do NOT Smoke (Tobacco/Vaping) or drink Alcohol 24 hours prior to your procedure If you use a CPAP at night, you may bring all equipment for your overnight stay.   Contacts, glasses, dentures or bridgework may not be worn into surgery, please bring cases for these belongings   For patients admitted to the hospital, discharge time will be determined by your treatment team.   Patients discharged the day of surgery will not be allowed to drive home, and someone needs to stay with them for 24 hours.    Special instructions:   [place]- Preparing For Surgery  Before surgery, you can play an important role. Because skin is not sterile, your skin needs to be as free of germs as possible. You can reduce the number of germs on your skin by washing  with CHG (chlorahexidine gluconate) Soap before surgery.  CHG is an antiseptic cleaner which kills germs and bonds with the skin to continue killing germs even after washing.    Oral Hygiene is also important to reduce your risk of infection.  Remember - BRUSH YOUR TEETH THE MORNING OF SURGERY WITH YOUR REGULAR TOOTHPASTE  Please do not use if you have an allergy to CHG or antibacterial soaps. If your skin becomes reddened/irritated stop using the CHG.  Do not shave (including legs and underarms) for at least 48 hours prior to first CHG shower. It is OK to shave your face.  Please follow these instructions carefully.   1. Shower the NIGHT BEFORE SURGERY and the MORNING OF SURGERY  2. If you chose to wash your hair, wash your hair first as usual with your normal shampoo.  3. After you shampoo, rinse your hair and body thoroughly to remove the shampoo.  4. Wash Face and genitals (private parts) with your normal soap.   5.  Shower the NIGHT BEFORE SURGERY and the MORNING OF SURGERY with CHG Soap.   6. Use CHG Soap as you would any other liquid soap. You can apply CHG directly  to the skin and wash gently with a scrungie or a clean washcloth.   7. Apply the CHG Soap to your body ONLY FROM THE NECK DOWN.  Do not use on open wounds or open sores. Avoid contact with your eyes, ears, mouth and genitals (private parts). Wash Face and genitals (private parts)  with your normal soap.   8. Wash thoroughly, paying special attention to the area where your surgery will be performed.  9. Thoroughly rinse your body with warm water from the neck down.  10. DO NOT shower/wash with your normal soap after using and rinsing off the CHG Soap.  11. Pat yourself dry with a CLEAN TOWEL.  12. Wear CLEAN PAJAMAS to bed the night before surgery  13. Place CLEAN SHEETS on your bed the night before your surgery  14. DO NOT SLEEP WITH PETS.   Day of Surgery: Wear Clean/Comfortable clothing the morning of  surgery Do not apply any deodorants/lotions.   Remember to brush your teeth WITH YOUR REGULAR TOOTHPASTE.   Please read over the following fact sheets that you were given.

## 2020-07-04 ENCOUNTER — Ambulatory Visit (HOSPITAL_COMMUNITY)
Admission: RE | Admit: 2020-07-04 | Discharge: 2020-07-04 | Disposition: A | Discharge status: Home / Self Care | Payer: 59 | Source: Ambulatory Visit | Attending: Thoracic Surgery (Cardiothoracic Vascular Surgery) | Admitting: Thoracic Surgery (Cardiothoracic Vascular Surgery)

## 2020-07-04 ENCOUNTER — Other Ambulatory Visit (HOSPITAL_COMMUNITY)
Admission: RE | Admit: 2020-07-04 | Discharge: 2020-07-04 | Disposition: A | Discharge status: Home / Self Care | Payer: 59 | Source: Ambulatory Visit | Attending: Thoracic Surgery (Cardiothoracic Vascular Surgery) | Admitting: Thoracic Surgery (Cardiothoracic Vascular Surgery)

## 2020-07-04 ENCOUNTER — Encounter (HOSPITAL_COMMUNITY): Payer: Self-pay

## 2020-07-04 ENCOUNTER — Other Ambulatory Visit: Payer: Self-pay

## 2020-07-04 ENCOUNTER — Encounter (HOSPITAL_COMMUNITY)
Admission: RE | Admit: 2020-07-04 | Discharge: 2020-07-04 | Disposition: A | Discharge status: Home / Self Care | Payer: 59 | Source: Ambulatory Visit | Attending: Thoracic Surgery (Cardiothoracic Vascular Surgery) | Admitting: Thoracic Surgery (Cardiothoracic Vascular Surgery)

## 2020-07-04 DIAGNOSIS — Z01818 Encounter for other preprocedural examination: Secondary | ICD-10-CM | POA: Insufficient documentation

## 2020-07-04 DIAGNOSIS — Q245 Malformation of coronary vessels: Secondary | ICD-10-CM | POA: Insufficient documentation

## 2020-07-04 DIAGNOSIS — E669 Obesity, unspecified: Secondary | ICD-10-CM | POA: Insufficient documentation

## 2020-07-04 DIAGNOSIS — Z79899 Other long term (current) drug therapy: Secondary | ICD-10-CM | POA: Insufficient documentation

## 2020-07-04 DIAGNOSIS — I10 Essential (primary) hypertension: Secondary | ICD-10-CM | POA: Insufficient documentation

## 2020-07-04 DIAGNOSIS — F419 Anxiety disorder, unspecified: Secondary | ICD-10-CM | POA: Insufficient documentation

## 2020-07-04 DIAGNOSIS — Z6831 Body mass index (BMI) 31.0-31.9, adult: Secondary | ICD-10-CM | POA: Insufficient documentation

## 2020-07-04 DIAGNOSIS — Z7901 Long term (current) use of anticoagulants: Secondary | ICD-10-CM | POA: Insufficient documentation

## 2020-07-04 DIAGNOSIS — Z01812 Encounter for preprocedural laboratory examination: Secondary | ICD-10-CM | POA: Insufficient documentation

## 2020-07-04 DIAGNOSIS — Z87891 Personal history of nicotine dependence: Secondary | ICD-10-CM | POA: Insufficient documentation

## 2020-07-04 DIAGNOSIS — Z20822 Contact with and (suspected) exposure to covid-19: Secondary | ICD-10-CM | POA: Insufficient documentation

## 2020-07-04 HISTORY — DX: Anxiety disorder, unspecified: F41.9

## 2020-07-04 LAB — URINALYSIS, ROUTINE W REFLEX MICROSCOPIC
Bilirubin Urine: NEGATIVE
Glucose, UA: NEGATIVE mg/dL
Hgb urine dipstick: NEGATIVE
Ketones, ur: NEGATIVE mg/dL
Leukocytes,Ua: NEGATIVE
Nitrite: NEGATIVE
Protein, ur: NEGATIVE mg/dL
Specific Gravity, Urine: 1.013 (ref 1.005–1.030)
pH: 6 (ref 5.0–8.0)

## 2020-07-04 LAB — COMPREHENSIVE METABOLIC PANEL
ALT: 31 U/L (ref 0–44)
AST: 25 U/L (ref 15–41)
Albumin: 4.2 g/dL (ref 3.5–5.0)
Alkaline Phosphatase: 43 U/L (ref 38–126)
Anion gap: 12 (ref 5–15)
BUN: 17 mg/dL (ref 6–20)
CO2: 22 mmol/L (ref 22–32)
Calcium: 9.6 mg/dL (ref 8.9–10.3)
Chloride: 102 mmol/L (ref 98–111)
Creatinine, Ser: 0.86 mg/dL (ref 0.61–1.24)
GFR, Estimated: >60 mL/min (ref >60)
Glucose, Bld: 94 mg/dL (ref 70–99)
Potassium: 4 mmol/L (ref 3.5–5.1)
Sodium: 136 mmol/L (ref 135–145)
Total Bilirubin: 1.3 mg/dL — ABNORMAL HIGH (ref 0.3–1.2)
Total Protein: 7.5 g/dL (ref 6.5–8.1)

## 2020-07-04 LAB — BLOOD GAS, ARTERIAL
Acid-Base Excess: 2.1 mmol/L — ABNORMAL HIGH (ref 0.0–2.0)
Bicarbonate: 26.1 mmol/L (ref 20.0–28.0)
Drawn by: 60286
FIO2: 21
O2 Saturation: 97.4 %
Patient temperature: 37
pCO2 arterial: 40.6 mmHg (ref 32.0–48.0)
pH, Arterial: 7.424 (ref 7.350–7.450)
pO2, Arterial: 94.2 mmHg (ref 83.0–108.0)

## 2020-07-04 LAB — CBC
HCT: 38.2 % — ABNORMAL LOW (ref 39.0–52.0)
Hemoglobin: 13.5 g/dL (ref 13.0–17.0)
MCH: 30 pg (ref 26.0–34.0)
MCHC: 35.3 g/dL (ref 30.0–36.0)
MCV: 84.9 fL (ref 80.0–100.0)
Platelets: 200 10*3/uL (ref 150–400)
RBC: 4.5 MIL/uL (ref 4.22–5.81)
RDW: 13.2 % (ref 11.5–15.5)
WBC: 4.5 10*3/uL (ref 4.0–10.5)
nRBC: 0 % (ref 0.0–0.2)

## 2020-07-04 LAB — PROTIME-INR
INR: 1.1 (ref 0.8–1.2)
Prothrombin Time: 13.3 seconds (ref 11.4–15.2)

## 2020-07-04 LAB — SURGICAL PCR SCREEN
MRSA, PCR: NEGATIVE
Staphylococcus aureus: NEGATIVE

## 2020-07-04 LAB — TYPE AND SCREEN
ABO/RH(D): O POS
Antibody Screen: NEGATIVE

## 2020-07-04 LAB — SARS CORONAVIRUS 2 (TAT 6-24 HRS): SARS Coronavirus 2: NEGATIVE

## 2020-07-04 LAB — APTT: aPTT: 31 seconds (ref 24–36)

## 2020-07-04 NOTE — Progress Notes (Addendum)
Anesthesia Chart Review:  Case: [id number] Date/Time: 07/06/20 0815   Procedures:      CORONARY ARTERY BYPASS GRAFTING (CABG) - CORONARY UNROOFING (N/A Chest)     TRANSESOPHAGEAL ECHOCARDIOGRAM (TEE) (N/A )   Anesthesia type: General   Pre-op diagnosis: LCA ANOMOLY   [facility] ROOM [facility] / [facility]   Surgeons: [name], MD      DISCUSSION: Patient is a 45 year old male scheduled for the above procedure.  History includes former smoker, HTN, anomalous left coronary artery, pericarditis (05/09/20), anxiety. BMI is consistent with mild obesity. - Admission 05/09/20-05/11/20 for suspected acute pericarditis, elevated HS troponins (53-->34), hypertensive urgency (BP 161-173/106-122). History of polysubstance use (smoking 2 cigars and 2 marijuana joints/day and drinking 1/2-1 pint vodka/day). Echo showed EF 55-60%, no regional wall motion abnormalities, mild LVH, grade 1 DD. Started on ibuprofen and colchicine for pericarditis and antihypertensive medications started for HTN. Coronary CTA ordered to evaluate for CAD and showed anomalous origin of his left coronary artery arising from the right coronary cusp. His stress test was non-ischemic, so out-patient elective repair recommended. (Of note, per 06/03/20 note by cardiologist Dr. Jacques Navy, follow-up EKG also showed elevated ST segments, so questioned whether he truly had pericarditis in 04/2020 or if EKG changes represent repolarization abnormality. Continued course of colchicine recommended through cardiac surgery date.) Recently quit smoking cigars.   07/04/20 preoperative COVID-19 test is in process.  07/05/19 A1c and CXR are also in process. (UPDATE 07/05/20 9:30 AM: CXR, A1c, COVID testing results reviewed. Anesthesia team to evaluate on the day of surgery.)   VS: BP 130/88   Pulse 64   Temp 36.9 C (Oral)   Resp 18   Ht 5\' 11"  (1.803 m)   Wt 102.2 kg   SpO2 100%   BMI 31.44 kg/m     PROVIDERS: , DO is PCP   Sharlene Dory, MD is cardiologist   LABS: Labs reviewed: Acceptable for surgery. (all labs ordered are listed, but only abnormal results are displayed)  Labs Reviewed  CBC - Abnormal; Notable for the following components:      Result Value   HCT 38.2 (*)    All other components within normal limits  COMPREHENSIVE METABOLIC PANEL - Abnormal; Notable for the following components:   Total Bilirubin 1.3 (*)    All other components within normal limits  URINALYSIS, ROUTINE W REFLEX MICROSCOPIC - Abnormal; Notable for the following components:   Color, Urine STRAW (*)    All other components within normal limits  BLOOD GAS, ARTERIAL - Abnormal; Notable for the following components:   Acid-Base Excess 2.1 (*)    Allens test (pass/fail) BRACHIAL ARTERY (*)    All other components within normal limits  SURGICAL PCR SCREEN  PROTIME-INR  APTT  HEMOGLOBIN A1C  TYPE AND SCREEN     IMAGES: CXR 07/04/20:  FINDINGS: The heart size and mediastinal contours are within normal limits. Both lungs are clear. The visualized skeletal structures are unremarkable. IMPRESSION: No active cardiopulmonary disease.   EKG: 07/04/20: Normal sinus rhythm Possible Left atrial enlargement Borderline ECG   CV: Nuclear stress test 05/11/20:  Blood pressure demonstrated a normal response to exercise.  The study is normal.  This is a low risk study.  The left ventricular ejection fraction is mildly decreased (45-54%). Normal resting and stress perfusion. No ischemia or infarction EF Estimated 46% but no RWMA and normal by TTE this admission    CT Coronary/FFR 05/10/20: IMPRESSION:  1. 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 Ostium has slit-like appearance, and there is mild systolic compression of the body of the left main coronary artery. Right dominant circulation. CT FFR will be performed and  reported separately for ischemic evaluation. 2.  No evidence of CAD, CADRADS = 0. 3. Coronary calcium score of 0. FFR: 1. Left Main: FFR = 0.96 2. LAD: Proximal FFR = 0.94, mid FFR = 0.87, Distal FFR = 0.78 3. Ramus intermedius = proximal 0.96, distal 0.94 4. LCX: Proximal FFR = 0.94, distal FFR = 0.92 5. RCA: Proximal FFR = 0.96, mid FFR =0.93, distal FFR = 0.91 IMPRESSION: 1. CT FFR analysis showed no significant stenosis. No ischemia noted at rest with anomalous left main coronary artery with interarterial course. RECOMMENDATIONS: Consider exercise stress imaging to assess for ischemia.   Echo 05/09/20: IMPRESSIONS  1. Left ventricular ejection fraction, by estimation, is 55 to 60%. The  left ventricle has normal function. The left ventricle has no regional  wall motion abnormalities. There is mild left ventricular hypertrophy.  Left ventricular diastolic parameters  are consistent with Grade I diastolic dysfunction (impaired relaxation).  2. Right ventricular systolic function is normal. The right ventricular  size is normal. Tricuspid regurgitation signal is inadequate for assessing  PA pressure.  3. The mitral valve is abnormal. Trivial mitral valve regurgitation.  4. The aortic valve is tricuspid. Aortic valve regurgitation is not  visualized.  5. The inferior vena cava is normal in size with greater than 50%  respiratory variability, suggesting right atrial pressure of 3 mmHg.   6. There is right bowing of the interatrial septum, suggestive of elevated left atrial pressure. No atrial level shunt detected by color flow Doppler.  Comparison(s): No prior Echocardiogram.    Past Medical History:  Diagnosis Date  . 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 Anxiety   . HTN (hypertension)   . Pericarditis 05/09/2020    Past Surgical History:  Procedure Laterality Date  . KNEE SURGERY Right 1992  . LAPAROSCOPIC APPENDECTOMY N/A 01/28/2015   Procedure:  APPENDECTOMY LAPAROSCOPIC;  Surgeon: Claud Kelp, MD;  Location: [facility];  Service: General;  Laterality: N/A;    MEDICATIONS: . acetaminophen (TYLENOL) 500 MG tablet  . amLODipine (NORVASC) 10 MG tablet  . carvedilol (COREG) 12.5 MG tablet  . colchicine 0.6 MG tablet  . cyclobenzaprine (FLEXERIL) 5 MG tablet  . hydrOXYzine (ATARAX/VISTARIL) 25 MG tablet  . ibuprofen (ADVIL) 800 MG tablet   No current facility-administered medications for this encounter.  As of 06/23/20, not currently taking colchicine or ibuprofen and had not started as needed hydroxyzine.    Shonna Chock, PA-C Surgical Short Stay/Anesthesiology [facility] Phone [phone] [facility] Phone [phone] 07/04/2020 3:42 PM

## 2020-07-04 NOTE — Anesthesia Preprocedure Evaluation (Addendum)
Anesthesia Evaluation  Patient identified by MRN, date of birth, ID band Patient awake    Reviewed: Allergy & Precautions, NPO status , Patient's Chart, lab work & pertinent test results  Airway Mallampati: II  TM Distance: >3 FB Neck ROM: Full    Dental no notable dental hx.    Pulmonary Patient abstained from smoking., former smoker,    Pulmonary exam normal breath sounds clear to auscultation       Cardiovascular hypertension, Pt. on medications and Pt. on home beta blockers + CAD  Normal cardiovascular exam Rhythm:Regular Rate:Normal     Neuro/Psych Anxiety negative neurological ROS     GI/Hepatic negative GI ROS, (+)     substance abuse  ,   Endo/Other  negative endocrine ROS  Renal/GU negative Renal ROS     Musculoskeletal negative musculoskeletal ROS (+)   Abdominal (+) + obese,   Peds  Hematology negative hematology ROS (+)   Anesthesia Other Findings [facility] ANOMOLY  Reproductive/Obstetrics                           Anesthesia Physical Anesthesia Plan  ASA: IV  Anesthesia Plan: General   Post-op Pain Management:    Induction: Intravenous  PONV Risk Score and Plan: 2 and Ondansetron, Dexamethasone, Midazolam and Treatment may vary due to age or medical condition  Airway Management Planned: Oral ETT  Additional Equipment: Arterial line, CVP, TEE and Ultrasound Guidance Line Placement  Intra-op Plan:   Post-operative Plan: Post-operative intubation/ventilation  Informed Consent: I have reviewed the patients History and Physical, chart, labs and discussed the procedure including the risks, benefits and alternatives for the proposed anesthesia with the patient or authorized representative who has indicated his/her understanding and acceptance.     Dental advisory given  Plan Discussed with: CRNA  Anesthesia Plan Comments: (Reviewed PAT note written by Shonna Chock,  PA-C. )      Anesthesia Quick Evaluation

## 2020-07-04 NOTE — Progress Notes (Signed)
PCP - Arva Chafe Cardiologist - Weston Brass  Chest x-ray - 07/04/20 EKG - 07/04/20 Stress Test - 05/11/20 ECHO - 05/09/20  COVID TEST- 07/04/20   Anesthesia review: yes, heart history  Patient denies shortness of breath, fever, cough and chest pain at PAT appointment   All instructions explained to the patient, with a verbal understanding of the material. Patient agrees to go over the instructions while at home for a better understanding. Patient also instructed to self quarantine after being tested for COVID-19. The opportunity to ask questions was provided.

## 2020-07-05 LAB — HEMOGLOBIN A1C
Hgb A1c MFr Bld: 5.5 % (ref 4.8–5.6)
Mean Plasma Glucose: 111 mg/dL

## 2020-07-05 MED ORDER — POTASSIUM CHLORIDE 2 MEQ/ML IV SOLN
80.0000 meq | INTRAVENOUS | Status: DC
  Filled 2020-07-05: qty 40

## 2020-07-05 MED ORDER — MILRINONE LACTATE IN DEXTROSE 20-5 MG/100ML-% IV SOLN
0.3000 ug/kg/min | INTRAVENOUS | Status: DC
  Filled 2020-07-05: qty 100

## 2020-07-05 MED ORDER — TRANEXAMIC ACID (OHS) BOLUS VIA INFUSION
15.0000 mg/kg | INTRAVENOUS | Status: AC
  Administered 2020-07-06: 1533 mg via INTRAVENOUS
  Filled 2020-07-05: qty 1533

## 2020-07-05 MED ORDER — PLASMA-LYTE 148 IV SOLN
INTRAVENOUS | Status: DC
  Filled 2020-07-05: qty 2.5

## 2020-07-05 MED ORDER — SODIUM CHLORIDE 0.9 % IV SOLN
750.0000 mg | INTRAVENOUS | Status: AC
  Administered 2020-07-06: 750 mg via INTRAVENOUS
  Filled 2020-07-05: qty 750

## 2020-07-05 MED ORDER — PHENYLEPHRINE HCL-NACL 20-0.9 MG/250ML-% IV SOLN
30.0000 ug/min | INTRAVENOUS | Status: AC
  Administered 2020-07-06: 20 ug/min via INTRAVENOUS
  Filled 2020-07-05: qty 250

## 2020-07-05 MED ORDER — EPINEPHRINE HCL 5 MG/250ML IV SOLN IN NS
0.0000 ug/min | INTRAVENOUS | Status: DC
  Filled 2020-07-05: qty 250

## 2020-07-05 MED ORDER — TRANEXAMIC ACID (OHS) PUMP PRIME SOLUTION
2.0000 mg/kg | INTRAVENOUS | Status: DC
  Filled 2020-07-05: qty 2.04

## 2020-07-05 MED ORDER — SODIUM CHLORIDE 0.9 % IV SOLN
1.5000 g | INTRAVENOUS | Status: AC
  Administered 2020-07-06: 1.5 g via INTRAVENOUS
  Filled 2020-07-05: qty 1.5

## 2020-07-05 MED ORDER — TRANEXAMIC ACID 1000 MG/10ML IV SOLN
1.5000 mg/kg/h | INTRAVENOUS | Status: AC
  Administered 2020-07-06: 1.5 mg/kg/h via INTRAVENOUS
  Filled 2020-07-05: qty 25

## 2020-07-05 MED ORDER — SODIUM CHLORIDE 0.9 % IV SOLN
INTRAVENOUS | Status: DC
  Filled 2020-07-05: qty 30

## 2020-07-05 MED ORDER — MANNITOL 20 % IV SOLN
INTRAVENOUS | Status: DC
  Filled 2020-07-05: qty 13

## 2020-07-05 MED ORDER — DEXMEDETOMIDINE HCL IN NACL 400 MCG/100ML IV SOLN
0.1000 ug/kg/h | INTRAVENOUS | Status: AC
  Administered 2020-07-06: .3 ug/kg/h via INTRAVENOUS
  Filled 2020-07-05: qty 100

## 2020-07-05 MED ORDER — VANCOMYCIN HCL 1500 MG/300ML IV SOLN
1500.0000 mg | INTRAVENOUS | Status: AC
  Administered 2020-07-06: 1500 mg via INTRAVENOUS
  Filled 2020-07-05: qty 300

## 2020-07-05 MED ORDER — NOREPINEPHRINE 4 MG/250ML-% IV SOLN
0.0000 ug/min | INTRAVENOUS | Status: DC
  Filled 2020-07-05: qty 250

## 2020-07-05 MED ORDER — INSULIN REGULAR(HUMAN) IN NACL 100-0.9 UT/100ML-% IV SOLN
INTRAVENOUS | Status: AC
  Administered 2020-07-06: 1 [IU]/h via INTRAVENOUS
  Filled 2020-07-05: qty 100

## 2020-07-05 MED ORDER — NITROGLYCERIN IN D5W 200-5 MCG/ML-% IV SOLN
2.0000 ug/min | INTRAVENOUS | Status: DC
  Filled 2020-07-05: qty 250

## 2020-07-06 ENCOUNTER — Inpatient Hospital Stay (HOSPITAL_COMMUNITY): Payer: 59 | Admitting: Vascular Surgery

## 2020-07-06 ENCOUNTER — Inpatient Hospital Stay (HOSPITAL_COMMUNITY)
Admission: RE | Admit: 2020-07-06 | Discharge: 2020-07-10 | DRG: 236 | Disposition: A | Discharge status: Home / Self Care | Payer: 59 | Attending: Thoracic Surgery (Cardiothoracic Vascular Surgery) | Admitting: Thoracic Surgery (Cardiothoracic Vascular Surgery)

## 2020-07-06 ENCOUNTER — Inpatient Hospital Stay (HOSPITAL_COMMUNITY): Payer: 59

## 2020-07-06 ENCOUNTER — Encounter (HOSPITAL_COMMUNITY): Payer: Self-pay | Admitting: Thoracic Surgery (Cardiothoracic Vascular Surgery)

## 2020-07-06 ENCOUNTER — Other Ambulatory Visit: Payer: Self-pay

## 2020-07-06 ENCOUNTER — Inpatient Hospital Stay (HOSPITAL_COMMUNITY): Payer: 59 | Admitting: Certified Registered Nurse Anesthetist

## 2020-07-06 ENCOUNTER — Inpatient Hospital Stay (HOSPITAL_COMMUNITY)
Admission: RE | Disposition: A | Discharge status: Home / Self Care | Payer: Self-pay | Source: Home / Self Care | Attending: Thoracic Surgery (Cardiothoracic Vascular Surgery)

## 2020-07-06 DIAGNOSIS — Q245 Malformation of coronary vessels: Secondary | ICD-10-CM | POA: Diagnosis present

## 2020-07-06 DIAGNOSIS — Z20822 Contact with and (suspected) exposure to covid-19: Secondary | ICD-10-CM | POA: Diagnosis present

## 2020-07-06 DIAGNOSIS — Z951 Presence of aortocoronary bypass graft: Secondary | ICD-10-CM

## 2020-07-06 DIAGNOSIS — Z87891 Personal history of nicotine dependence: Secondary | ICD-10-CM | POA: Diagnosis not present

## 2020-07-06 DIAGNOSIS — Z791 Long term (current) use of non-steroidal anti-inflammatories (NSAID): Secondary | ICD-10-CM | POA: Diagnosis not present

## 2020-07-06 DIAGNOSIS — Z09 Encounter for follow-up examination after completed treatment for conditions other than malignant neoplasm: Secondary | ICD-10-CM

## 2020-07-06 DIAGNOSIS — Z79899 Other long term (current) drug therapy: Secondary | ICD-10-CM | POA: Diagnosis not present

## 2020-07-06 DIAGNOSIS — Z8249 Family history of ischemic heart disease and other diseases of the circulatory system: Secondary | ICD-10-CM | POA: Diagnosis not present

## 2020-07-06 DIAGNOSIS — J90 Pleural effusion, not elsewhere classified: Secondary | ICD-10-CM

## 2020-07-06 HISTORY — PX: CORONARY ARTERY BYPASS GRAFT: SHX141

## 2020-07-06 HISTORY — PX: TEE WITHOUT CARDIOVERSION: SHX5443

## 2020-07-06 LAB — PROTIME-INR
INR: 1.6 — ABNORMAL HIGH (ref 0.8–1.2)
Prothrombin Time: 18.4 seconds — ABNORMAL HIGH (ref 11.4–15.2)

## 2020-07-06 LAB — POCT I-STAT 7, (LYTES, BLD GAS, ICA,H+H)
Acid-Base Excess: 2 mmol/L (ref 0.0–2.0)
Acid-Base Excess: 9 mmol/L — ABNORMAL HIGH (ref 0.0–2.0)
Acid-Base Excess: 3 mmol/L — ABNORMAL HIGH (ref 0.0–2.0)
Acid-Base Excess: 0 mmol/L (ref 0.0–2.0)
Acid-Base Excess: 0 mmol/L (ref 0.0–2.0)
Acid-base deficit: 4 mmol/L — ABNORMAL HIGH (ref 0.0–2.0)
Acid-base deficit: 5 mmol/L — ABNORMAL HIGH (ref 0.0–2.0)
Acid-base deficit: 3 mmol/L — ABNORMAL HIGH (ref 0.0–2.0)
Bicarbonate: 28.9 mmol/L — ABNORMAL HIGH (ref 20.0–28.0)
Bicarbonate: 31.7 mmol/L — ABNORMAL HIGH (ref 20.0–28.0)
Bicarbonate: 27.7 mmol/L (ref 20.0–28.0)
Bicarbonate: 24.6 mmol/L (ref 20.0–28.0)
Bicarbonate: 25.1 mmol/L (ref 20.0–28.0)
Bicarbonate: 21.4 mmol/L (ref 20.0–28.0)
Bicarbonate: 19.5 mmol/L — ABNORMAL LOW (ref 20.0–28.0)
Bicarbonate: 22 mmol/L (ref 20.0–28.0)
Calcium, Ion: 1.27 mmol/L (ref 1.15–1.40)
Calcium, Ion: 1.05 mmol/L — ABNORMAL LOW (ref 1.15–1.40)
Calcium, Ion: 1.11 mmol/L — ABNORMAL LOW (ref 1.15–1.40)
Calcium, Ion: 1.06 mmol/L — ABNORMAL LOW (ref 1.15–1.40)
Calcium, Ion: 1.13 mmol/L — ABNORMAL LOW (ref 1.15–1.40)
Calcium, Ion: 1.08 mmol/L — ABNORMAL LOW (ref 1.15–1.40)
Calcium, Ion: 1.02 mmol/L — ABNORMAL LOW (ref 1.15–1.40)
Calcium, Ion: 1.1 mmol/L — ABNORMAL LOW (ref 1.15–1.40)
HCT: 39 % (ref 39.0–52.0)
HCT: 30 % — ABNORMAL LOW (ref 39.0–52.0)
HCT: 31 % — ABNORMAL LOW (ref 39.0–52.0)
HCT: 26 % — ABNORMAL LOW (ref 39.0–52.0)
HCT: 30 % — ABNORMAL LOW (ref 39.0–52.0)
HCT: 29 % — ABNORMAL LOW (ref 39.0–52.0)
HCT: 27 % — ABNORMAL LOW (ref 39.0–52.0)
HCT: 29 % — ABNORMAL LOW (ref 39.0–52.0)
Hemoglobin: 13.3 g/dL (ref 13.0–17.0)
Hemoglobin: 10.2 g/dL — ABNORMAL LOW (ref 13.0–17.0)
Hemoglobin: 10.5 g/dL — ABNORMAL LOW (ref 13.0–17.0)
Hemoglobin: 8.8 g/dL — ABNORMAL LOW (ref 13.0–17.0)
Hemoglobin: 10.2 g/dL — ABNORMAL LOW (ref 13.0–17.0)
Hemoglobin: 9.9 g/dL — ABNORMAL LOW (ref 13.0–17.0)
Hemoglobin: 9.2 g/dL — ABNORMAL LOW (ref 13.0–17.0)
Hemoglobin: 9.9 g/dL — ABNORMAL LOW (ref 13.0–17.0)
O2 Saturation: 100 %
O2 Saturation: 100 %
O2 Saturation: 100 %
O2 Saturation: 98 %
O2 Saturation: 95 %
O2 Saturation: 96 %
O2 Saturation: 96 %
O2 Saturation: 95 %
Patient temperature: 36.6
Patient temperature: 37.4
Patient temperature: 37.7
Patient temperature: 38
Potassium: 3.6 mmol/L (ref 3.5–5.1)
Potassium: 3.5 mmol/L (ref 3.5–5.1)
Potassium: 3.9 mmol/L (ref 3.5–5.1)
Potassium: 3.7 mmol/L (ref 3.5–5.1)
Potassium: 4 mmol/L (ref 3.5–5.1)
Potassium: 3.8 mmol/L (ref 3.5–5.1)
Potassium: 3.7 mmol/L (ref 3.5–5.1)
Potassium: 4 mmol/L (ref 3.5–5.1)
Sodium: 140 mmol/L (ref 135–145)
Sodium: 140 mmol/L (ref 135–145)
Sodium: 138 mmol/L (ref 135–145)
Sodium: 141 mmol/L (ref 135–145)
Sodium: 140 mmol/L (ref 135–145)
Sodium: 141 mmol/L (ref 135–145)
Sodium: 141 mmol/L (ref 135–145)
Sodium: 141 mmol/L (ref 135–145)
TCO2: 30 mmol/L (ref 22–32)
TCO2: 33 mmol/L — ABNORMAL HIGH (ref 22–32)
TCO2: 29 mmol/L (ref 22–32)
TCO2: 26 mmol/L (ref 22–32)
TCO2: 26 mmol/L (ref 22–32)
TCO2: 23 mmol/L (ref 22–32)
TCO2: 20 mmol/L — ABNORMAL LOW (ref 22–32)
TCO2: 23 mmol/L (ref 22–32)
pCO2 arterial: 52.2 mmHg — ABNORMAL HIGH (ref 32.0–48.0)
pCO2 arterial: 36.6 mmHg (ref 32.0–48.0)
pCO2 arterial: 41.3 mmHg (ref 32.0–48.0)
pCO2 arterial: 41 mmHg (ref 32.0–48.0)
pCO2 arterial: 42.5 mmHg (ref 32.0–48.0)
pCO2 arterial: 38.1 mmHg (ref 32.0–48.0)
pCO2 arterial: 33.9 mmHg (ref 32.0–48.0)
pCO2 arterial: 39.1 mmHg (ref 32.0–48.0)
pH, Arterial: 7.351 (ref 7.350–7.450)
pH, Arterial: 7.545 — ABNORMAL HIGH (ref 7.350–7.450)
pH, Arterial: 7.434 (ref 7.350–7.450)
pH, Arterial: 7.387 (ref 7.350–7.450)
pH, Arterial: 7.378 (ref 7.350–7.450)
pH, Arterial: 7.36 (ref 7.350–7.450)
pH, Arterial: 7.371 (ref 7.350–7.450)
pH, Arterial: 7.364 (ref 7.350–7.450)
pO2, Arterial: 337 mmHg — ABNORMAL HIGH (ref 83.0–108.0)
pO2, Arterial: 285 mmHg — ABNORMAL HIGH (ref 83.0–108.0)
pO2, Arterial: 249 mmHg — ABNORMAL HIGH (ref 83.0–108.0)
pO2, Arterial: 116 mmHg — ABNORMAL HIGH (ref 83.0–108.0)
pO2, Arterial: 76 mmHg — ABNORMAL LOW (ref 83.0–108.0)
pO2, Arterial: 88 mmHg (ref 83.0–108.0)
pO2, Arterial: 86 mmHg (ref 83.0–108.0)
pO2, Arterial: 81 mmHg — ABNORMAL LOW (ref 83.0–108.0)

## 2020-07-06 LAB — POCT I-STAT, CHEM 8
BUN: 14 mg/dL (ref 6–20)
BUN: 13 mg/dL (ref 6–20)
BUN: 13 mg/dL (ref 6–20)
BUN: 13 mg/dL (ref 6–20)
BUN: 13 mg/dL (ref 6–20)
BUN: 12 mg/dL (ref 6–20)
Calcium, Ion: 1.32 mmol/L (ref 1.15–1.40)
Calcium, Ion: 1.26 mmol/L (ref 1.15–1.40)
Calcium, Ion: 1.09 mmol/L — ABNORMAL LOW (ref 1.15–1.40)
Calcium, Ion: 1.16 mmol/L (ref 1.15–1.40)
Calcium, Ion: 1.19 mmol/L (ref 1.15–1.40)
Calcium, Ion: 1.09 mmol/L — ABNORMAL LOW (ref 1.15–1.40)
Chloride: 101 mmol/L (ref 98–111)
Chloride: 102 mmol/L (ref 98–111)
Chloride: 100 mmol/L (ref 98–111)
Chloride: 103 mmol/L (ref 98–111)
Chloride: 102 mmol/L (ref 98–111)
Chloride: 103 mmol/L (ref 98–111)
Creatinine, Ser: 0.8 mg/dL (ref 0.61–1.24)
Creatinine, Ser: 0.8 mg/dL (ref 0.61–1.24)
Creatinine, Ser: 0.7 mg/dL (ref 0.61–1.24)
Creatinine, Ser: 0.7 mg/dL (ref 0.61–1.24)
Creatinine, Ser: 0.7 mg/dL (ref 0.61–1.24)
Creatinine, Ser: 0.7 mg/dL (ref 0.61–1.24)
Glucose, Bld: 110 mg/dL — ABNORMAL HIGH (ref 70–99)
Glucose, Bld: 107 mg/dL — ABNORMAL HIGH (ref 70–99)
Glucose, Bld: 107 mg/dL — ABNORMAL HIGH (ref 70–99)
Glucose, Bld: 147 mg/dL — ABNORMAL HIGH (ref 70–99)
Glucose, Bld: 161 mg/dL — ABNORMAL HIGH (ref 70–99)
Glucose, Bld: 110 mg/dL — ABNORMAL HIGH (ref 70–99)
HCT: 39 % (ref 39.0–52.0)
HCT: 36 % — ABNORMAL LOW (ref 39.0–52.0)
HCT: 29 % — ABNORMAL LOW (ref 39.0–52.0)
HCT: 30 % — ABNORMAL LOW (ref 39.0–52.0)
HCT: 30 % — ABNORMAL LOW (ref 39.0–52.0)
HCT: 24 % — ABNORMAL LOW (ref 39.0–52.0)
Hemoglobin: 13.3 g/dL (ref 13.0–17.0)
Hemoglobin: 12.2 g/dL — ABNORMAL LOW (ref 13.0–17.0)
Hemoglobin: 10.2 g/dL — ABNORMAL LOW (ref 13.0–17.0)
Hemoglobin: 9.9 g/dL — ABNORMAL LOW (ref 13.0–17.0)
Hemoglobin: 10.2 g/dL — ABNORMAL LOW (ref 13.0–17.0)
Hemoglobin: 8.2 g/dL — ABNORMAL LOW (ref 13.0–17.0)
Potassium: 3.6 mmol/L (ref 3.5–5.1)
Potassium: 3.7 mmol/L (ref 3.5–5.1)
Potassium: 3.6 mmol/L (ref 3.5–5.1)
Potassium: 4.2 mmol/L (ref 3.5–5.1)
Potassium: 4 mmol/L (ref 3.5–5.1)
Potassium: 3.7 mmol/L (ref 3.5–5.1)
Sodium: 141 mmol/L (ref 135–145)
Sodium: 139 mmol/L (ref 135–145)
Sodium: 139 mmol/L (ref 135–145)
Sodium: 141 mmol/L (ref 135–145)
Sodium: 140 mmol/L (ref 135–145)
Sodium: 141 mmol/L (ref 135–145)
TCO2: 29 mmol/L (ref 22–32)
TCO2: 27 mmol/L (ref 22–32)
TCO2: 28 mmol/L (ref 22–32)
TCO2: 30 mmol/L (ref 22–32)
TCO2: 27 mmol/L (ref 22–32)
TCO2: 24 mmol/L (ref 22–32)

## 2020-07-06 LAB — CBC
HCT: 31.8 % — ABNORMAL LOW (ref 39.0–52.0)
HCT: 30 % — ABNORMAL LOW (ref 39.0–52.0)
Hemoglobin: 10.7 g/dL — ABNORMAL LOW (ref 13.0–17.0)
Hemoglobin: 10.8 g/dL — ABNORMAL LOW (ref 13.0–17.0)
MCH: 29 pg (ref 26.0–34.0)
MCH: 30.1 pg (ref 26.0–34.0)
MCHC: 33.6 g/dL (ref 30.0–36.0)
MCHC: 36 g/dL (ref 30.0–36.0)
MCV: 86.2 fL (ref 80.0–100.0)
MCV: 83.6 fL (ref 80.0–100.0)
Platelets: 117 10*3/uL — ABNORMAL LOW (ref 150–400)
Platelets: 133 10*3/uL — ABNORMAL LOW (ref 150–400)
RBC: 3.69 MIL/uL — ABNORMAL LOW (ref 4.22–5.81)
RBC: 3.59 MIL/uL — ABNORMAL LOW (ref 4.22–5.81)
RDW: 13 % (ref 11.5–15.5)
RDW: 12.9 % (ref 11.5–15.5)
WBC: 10.9 10*3/uL — ABNORMAL HIGH (ref 4.0–10.5)
WBC: 8.8 10*3/uL (ref 4.0–10.5)
nRBC: 0 % (ref 0.0–0.2)
nRBC: 0 % (ref 0.0–0.2)

## 2020-07-06 LAB — GLUCOSE, CAPILLARY
Glucose-Capillary: 94 mg/dL (ref 70–99)
Glucose-Capillary: 97 mg/dL (ref 70–99)
Glucose-Capillary: 101 mg/dL — ABNORMAL HIGH (ref 70–99)
Glucose-Capillary: 100 mg/dL — ABNORMAL HIGH (ref 70–99)
Glucose-Capillary: 106 mg/dL — ABNORMAL HIGH (ref 70–99)
Glucose-Capillary: 128 mg/dL — ABNORMAL HIGH (ref 70–99)
Glucose-Capillary: 114 mg/dL — ABNORMAL HIGH (ref 70–99)
Glucose-Capillary: 108 mg/dL — ABNORMAL HIGH (ref 70–99)

## 2020-07-06 LAB — APTT: aPTT: 31 seconds (ref 24–36)

## 2020-07-06 LAB — POCT I-STAT EG7
Acid-Base Excess: 6 mmol/L — ABNORMAL HIGH (ref 0.0–2.0)
Bicarbonate: 30.4 mmol/L — ABNORMAL HIGH (ref 20.0–28.0)
Calcium, Ion: 1.03 mmol/L — ABNORMAL LOW (ref 1.15–1.40)
HCT: 30 % — ABNORMAL LOW (ref 39.0–52.0)
Hemoglobin: 10.2 g/dL — ABNORMAL LOW (ref 13.0–17.0)
O2 Saturation: 78 %
Potassium: 3.8 mmol/L (ref 3.5–5.1)
Sodium: 142 mmol/L (ref 135–145)
TCO2: 32 mmol/L (ref 22–32)
pCO2, Ven: 42.6 mmHg — ABNORMAL LOW (ref 44.0–60.0)
pH, Ven: 7.462 — ABNORMAL HIGH (ref 7.250–7.430)
pO2, Ven: 40 mmHg (ref 32.0–45.0)

## 2020-07-06 LAB — BASIC METABOLIC PANEL
Anion gap: 7 (ref 5–15)
BUN: 11 mg/dL (ref 6–20)
CO2: 21 mmol/L — ABNORMAL LOW (ref 22–32)
Calcium: 7.6 mg/dL — ABNORMAL LOW (ref 8.9–10.3)
Chloride: 105 mmol/L (ref 98–111)
Creatinine, Ser: 0.84 mg/dL (ref 0.61–1.24)
GFR, Estimated: >60 mL/min (ref >60)
Glucose, Bld: 120 mg/dL — ABNORMAL HIGH (ref 70–99)
Potassium: 4.1 mmol/L (ref 3.5–5.1)
Sodium: 133 mmol/L — ABNORMAL LOW (ref 135–145)

## 2020-07-06 LAB — ECHO INTRAOPERATIVE TEE
AV Mean grad: 4 mmHg
AV Peak grad: 5.7 mmHg
Ao pk vel: 1.19 m/s
Height: 71 in
S' Lateral: 3.8 cm
Weight: 3520 oz

## 2020-07-06 LAB — ABO/RH: ABO/RH(D): O POS

## 2020-07-06 LAB — HEMOGLOBIN AND HEMATOCRIT, BLOOD
HCT: 30.5 % — ABNORMAL LOW (ref 39.0–52.0)
Hemoglobin: 10.4 g/dL — ABNORMAL LOW (ref 13.0–17.0)

## 2020-07-06 LAB — MAGNESIUM: Magnesium: 2.9 mg/dL — ABNORMAL HIGH (ref 1.7–2.4)

## 2020-07-06 LAB — PLATELET COUNT: Platelets: 165 10*3/uL (ref 150–400)

## 2020-07-06 SURGERY — CORONARY ARTERY BYPASS GRAFTING (CABG)
Anesthesia: General | Site: Chest

## 2020-07-06 MED ORDER — METOPROLOL TARTRATE 12.5 MG HALF TABLET
12.5000 mg | ORAL_TABLET | Freq: Two times a day (BID) | ORAL | Status: DC
  Administered 2020-07-07 – 2020-07-10 (×7): 12.5 mg via ORAL
  Filled 2020-07-06 (×7): qty 1

## 2020-07-06 MED ORDER — SODIUM CHLORIDE 0.9% FLUSH: 3.0000 mL | INTRAVENOUS | Status: DC | PRN

## 2020-07-06 MED ORDER — SODIUM CHLORIDE 0.45 % IV SOLN: INTRAVENOUS | Status: DC | PRN

## 2020-07-06 MED ORDER — ONDANSETRON HCL 4 MG/2ML IJ SOLN
4.0000 mg | Freq: Four times a day (QID) | INTRAMUSCULAR | Status: DC | PRN
  Administered 2020-07-06 – 2020-07-08 (×4): 4 mg via INTRAVENOUS
  Filled 2020-07-06 (×4): qty 2

## 2020-07-06 MED ORDER — SODIUM CHLORIDE 0.9% FLUSH
10.0000 mL | Freq: Two times a day (BID) | INTRAVENOUS | Status: DC
  Administered 2020-07-06 – 2020-07-07 (×3): 10 mL

## 2020-07-06 MED ORDER — ALBUMIN HUMAN 5 % IV SOLN: INTRAVENOUS | Status: DC | PRN

## 2020-07-06 MED ORDER — SODIUM CHLORIDE 0.9 % IV SOLN
INTRAVENOUS | Status: DC
  Administered 2020-07-07: 10 mL/h via INTRAVENOUS

## 2020-07-06 MED ORDER — LACTATED RINGERS IV SOLN: INTRAVENOUS | Status: DC | PRN

## 2020-07-06 MED ORDER — ROCURONIUM BROMIDE 10 MG/ML (PF) SYRINGE
PREFILLED_SYRINGE | INTRAVENOUS | Status: DC | PRN
  Administered 2020-07-06 (×3): 50 mg via INTRAVENOUS
  Administered 2020-07-06: 100 mg via INTRAVENOUS

## 2020-07-06 MED ORDER — CHLORHEXIDINE GLUCONATE 0.12 % MT SOLN
15.0000 mL | Freq: Once | OROMUCOSAL | Status: AC
  Administered 2020-07-06: 15 mL via OROMUCOSAL
  Filled 2020-07-06: qty 15

## 2020-07-06 MED ORDER — SODIUM CHLORIDE 0.9 % IV SOLN: 250.0000 mL | INTRAVENOUS | Status: DC

## 2020-07-06 MED ORDER — NICARDIPINE HCL IN NACL 20-0.86 MG/200ML-% IV SOLN
0.0000 mg/h | INTRAVENOUS | Status: DC
  Filled 2020-07-06: qty 200

## 2020-07-06 MED ORDER — FAMOTIDINE IN NACL 20-0.9 MG/50ML-% IV SOLN
20.0000 mg | Freq: Two times a day (BID) | INTRAVENOUS | Status: AC
  Administered 2020-07-06 (×2): 20 mg via INTRAVENOUS
  Filled 2020-07-06 (×2): qty 50

## 2020-07-06 MED ORDER — CHLORHEXIDINE GLUCONATE CLOTH 2 % EX PADS
6.0000 | MEDICATED_PAD | Freq: Every day | CUTANEOUS | Status: DC
  Administered 2020-07-06: 6 via TOPICAL

## 2020-07-06 MED ORDER — OXYCODONE HCL 5 MG PO TABS
5.0000 mg | ORAL_TABLET | ORAL | Status: DC | PRN
  Administered 2020-07-07 – 2020-07-08 (×4): 10 mg via ORAL
  Filled 2020-07-06 (×4): qty 2

## 2020-07-06 MED ORDER — CHLORHEXIDINE GLUCONATE 0.12% ORAL RINSE (MEDLINE KIT): 15.0000 mL | Freq: Two times a day (BID) | OROMUCOSAL | Status: DC

## 2020-07-06 MED ORDER — SODIUM CHLORIDE 0.9% FLUSH: 10.0000 mL | INTRAVENOUS | Status: DC | PRN

## 2020-07-06 MED ORDER — ALBUMIN HUMAN 5 % IV SOLN
250.0000 mL | INTRAVENOUS | Status: AC | PRN
  Administered 2020-07-06 – 2020-07-07 (×2): 12.5 g via INTRAVENOUS
  Filled 2020-07-06: qty 250

## 2020-07-06 MED ORDER — SODIUM CHLORIDE (PF) 0.9 % IJ SOLN
OROMUCOSAL | Status: DC | PRN
  Administered 2020-07-06: 12 mL via TOPICAL

## 2020-07-06 MED ORDER — MAGNESIUM SULFATE 4 GM/100ML IV SOLN
4.0000 g | Freq: Once | INTRAVENOUS | Status: AC
  Administered 2020-07-06: 4 g via INTRAVENOUS
  Filled 2020-07-06: qty 100

## 2020-07-06 MED ORDER — ACETAMINOPHEN 160 MG/5ML PO SOLN: 650.0000 mg | Freq: Once | ORAL | Status: AC

## 2020-07-06 MED ORDER — FENTANYL CITRATE (PF) 250 MCG/5ML IJ SOLN
INTRAMUSCULAR | Status: DC | PRN
  Administered 2020-07-06: 350 ug via INTRAVENOUS
  Administered 2020-07-06: 100 ug via INTRAVENOUS
  Administered 2020-07-06 (×4): 50 ug via INTRAVENOUS
  Administered 2020-07-06 (×3): 100 ug via INTRAVENOUS
  Administered 2020-07-06: 50 ug via INTRAVENOUS

## 2020-07-06 MED ORDER — CHLORHEXIDINE GLUCONATE 0.12 % MT SOLN
15.0000 mL | OROMUCOSAL | Status: AC
  Administered 2020-07-06: 15 mL via OROMUCOSAL

## 2020-07-06 MED ORDER — NITROGLYCERIN IN D5W 200-5 MCG/ML-% IV SOLN: 0.0000 ug/min | INTRAVENOUS | Status: DC

## 2020-07-06 MED ORDER — 0.9 % SODIUM CHLORIDE (POUR BTL) OPTIME
TOPICAL | Status: DC | PRN
  Administered 2020-07-06: 5000 mL

## 2020-07-06 MED ORDER — SODIUM CHLORIDE (PF) 0.9 % IJ SOLN
INTRAMUSCULAR | Status: AC
  Filled 2020-07-06: qty 10

## 2020-07-06 MED ORDER — EPHEDRINE 5 MG/ML INJ
INTRAVENOUS | Status: AC
  Filled 2020-07-06: qty 10

## 2020-07-06 MED ORDER — DOBUTAMINE IN D5W 4-5 MG/ML-% IV SOLN: 0.0000 ug/kg/min | INTRAVENOUS | Status: DC

## 2020-07-06 MED ORDER — MIDAZOLAM HCL (PF) 10 MG/2ML IJ SOLN
INTRAMUSCULAR | Status: AC
  Filled 2020-07-06: qty 2

## 2020-07-06 MED ORDER — DEXTROSE 50 % IV SOLN: 0.0000 mL | INTRAVENOUS | Status: DC | PRN

## 2020-07-06 MED ORDER — PLASMA-LYTE 148 IV SOLN
INTRAVENOUS | Status: DC | PRN
  Administered 2020-07-06: 500 mL via INTRAVASCULAR

## 2020-07-06 MED ORDER — CHLORHEXIDINE GLUCONATE 4 % EX LIQD: 30.0000 mL | CUTANEOUS | Status: DC

## 2020-07-06 MED ORDER — PHENYLEPHRINE HCL-NACL 20-0.9 MG/250ML-% IV SOLN: 0.0000 ug/min | INTRAVENOUS | Status: DC

## 2020-07-06 MED ORDER — PROPOFOL 10 MG/ML IV BOLUS
INTRAVENOUS | Status: DC | PRN
  Administered 2020-07-06: 200 mg via INTRAVENOUS

## 2020-07-06 MED ORDER — MORPHINE SULFATE (PF) 2 MG/ML IV SOLN
1.0000 mg | INTRAVENOUS | Status: DC | PRN
  Administered 2020-07-06: 2 mg via INTRAVENOUS
  Administered 2020-07-06: 1 mg via INTRAVENOUS
  Administered 2020-07-06: 2 mg via INTRAVENOUS
  Administered 2020-07-06: 4 mg via INTRAVENOUS
  Administered 2020-07-07: 2 mg via INTRAVENOUS
  Filled 2020-07-06: qty 1
  Filled 2020-07-06: qty 2
  Filled 2020-07-06 (×3): qty 1

## 2020-07-06 MED ORDER — ACETAMINOPHEN 160 MG/5ML PO SOLN: 1000.0000 mg | Freq: Four times a day (QID) | ORAL | Status: DC

## 2020-07-06 MED ORDER — ACETAMINOPHEN 500 MG PO TABS
1000.0000 mg | ORAL_TABLET | Freq: Four times a day (QID) | ORAL | Status: DC
  Administered 2020-07-06 – 2020-07-10 (×10): 1000 mg via ORAL
  Filled 2020-07-06 (×10): qty 2

## 2020-07-06 MED ORDER — EPHEDRINE SULFATE-NACL 50-0.9 MG/10ML-% IV SOSY
PREFILLED_SYRINGE | INTRAVENOUS | Status: DC | PRN
  Administered 2020-07-06: 5 mg via INTRAVENOUS

## 2020-07-06 MED ORDER — LACTATED RINGERS IV SOLN: 500.0000 mL | Freq: Once | INTRAVENOUS | Status: DC | PRN

## 2020-07-06 MED ORDER — SODIUM CHLORIDE 0.9 % IV SOLN
1.5000 g | Freq: Two times a day (BID) | INTRAVENOUS | Status: AC
  Administered 2020-07-06 – 2020-07-08 (×4): 1.5 g via INTRAVENOUS
  Filled 2020-07-06 (×4): qty 1.5

## 2020-07-06 MED ORDER — DEXMEDETOMIDINE HCL IN NACL 400 MCG/100ML IV SOLN
0.1000 ug/kg/h | INTRAVENOUS | Status: DC
  Filled 2020-07-06: qty 100

## 2020-07-06 MED ORDER — METOPROLOL TARTRATE 5 MG/5ML IV SOLN: 2.5000 mg | INTRAVENOUS | Status: DC | PRN

## 2020-07-06 MED ORDER — ASPIRIN EC 325 MG PO TBEC
325.0000 mg | DELAYED_RELEASE_TABLET | Freq: Every day | ORAL | Status: DC
  Administered 2020-07-07 – 2020-07-09 (×3): 325 mg via ORAL
  Filled 2020-07-06 (×4): qty 1

## 2020-07-06 MED ORDER — FENTANYL CITRATE (PF) 250 MCG/5ML IJ SOLN
INTRAMUSCULAR | Status: AC
  Filled 2020-07-06: qty 25

## 2020-07-06 MED ORDER — LACTATED RINGERS IV SOLN: INTRAVENOUS | Status: DC

## 2020-07-06 MED ORDER — BISACODYL 10 MG RE SUPP
10.0000 mg | Freq: Every day | RECTAL | Status: DC
  Filled 2020-07-06: qty 1

## 2020-07-06 MED ORDER — VANCOMYCIN HCL IN DEXTROSE 1-5 GM/200ML-% IV SOLN
1000.0000 mg | Freq: Once | INTRAVENOUS | Status: AC
  Administered 2020-07-06: 1000 mg via INTRAVENOUS
  Filled 2020-07-06: qty 200

## 2020-07-06 MED ORDER — TRAMADOL HCL 50 MG PO TABS
50.0000 mg | ORAL_TABLET | ORAL | Status: DC | PRN
  Administered 2020-07-07: 100 mg via ORAL
  Filled 2020-07-06: qty 2

## 2020-07-06 MED ORDER — DOCUSATE SODIUM 100 MG PO CAPS
200.0000 mg | ORAL_CAPSULE | Freq: Every day | ORAL | Status: DC
  Administered 2020-07-07 – 2020-07-09 (×3): 200 mg via ORAL
  Filled 2020-07-06 (×4): qty 2

## 2020-07-06 MED ORDER — PROPOFOL 10 MG/ML IV BOLUS
INTRAVENOUS | Status: AC
  Filled 2020-07-06: qty 40

## 2020-07-06 MED ORDER — ORAL CARE MOUTH RINSE
15.0000 mL | OROMUCOSAL | Status: DC
  Administered 2020-07-06 (×2): 15 mL via OROMUCOSAL

## 2020-07-06 MED ORDER — HEPARIN SODIUM (PORCINE) 1000 UNIT/ML IJ SOLN
INTRAMUSCULAR | Status: AC
  Filled 2020-07-06: qty 1

## 2020-07-06 MED ORDER — METOPROLOL TARTRATE 25 MG/10 ML ORAL SUSPENSION
12.5000 mg | Freq: Two times a day (BID) | ORAL | Status: DC
  Filled 2020-07-06 (×2): qty 5

## 2020-07-06 MED ORDER — INSULIN REGULAR(HUMAN) IN NACL 100-0.9 UT/100ML-% IV SOLN: INTRAVENOUS | Status: DC

## 2020-07-06 MED ORDER — DEXMEDETOMIDINE HCL IN NACL 400 MCG/100ML IV SOLN
0.0000 ug/kg/h | INTRAVENOUS | Status: DC
  Administered 2020-07-06: 1.2 ug/kg/h via INTRAVENOUS
  Filled 2020-07-06: qty 100

## 2020-07-06 MED ORDER — NOREPINEPHRINE 4 MG/250ML-% IV SOLN: 0.0000 ug/min | INTRAVENOUS | Status: DC

## 2020-07-06 MED ORDER — MIDAZOLAM HCL 2 MG/2ML IJ SOLN
2.0000 mg | INTRAMUSCULAR | Status: DC | PRN
  Administered 2020-07-06: 2 mg via INTRAVENOUS
  Filled 2020-07-06: qty 2

## 2020-07-06 MED ORDER — PHENYLEPHRINE 40 MCG/ML (10ML) SYRINGE FOR IV PUSH (FOR BLOOD PRESSURE SUPPORT)
PREFILLED_SYRINGE | INTRAVENOUS | Status: DC | PRN
  Administered 2020-07-06 (×2): 20 ug via INTRAVENOUS
  Administered 2020-07-06: 40 ug via INTRAVENOUS

## 2020-07-06 MED ORDER — ROCURONIUM BROMIDE 10 MG/ML (PF) SYRINGE
PREFILLED_SYRINGE | INTRAVENOUS | Status: AC
  Filled 2020-07-06: qty 10

## 2020-07-06 MED ORDER — CHLORHEXIDINE GLUCONATE CLOTH 2 % EX PADS
6.0000 | MEDICATED_PAD | Freq: Every day | CUTANEOUS | Status: DC
  Administered 2020-07-07: 6 via TOPICAL

## 2020-07-06 MED ORDER — ACETAMINOPHEN 650 MG RE SUPP
650.0000 mg | Freq: Once | RECTAL | Status: AC
  Administered 2020-07-06: 650 mg via RECTAL

## 2020-07-06 MED ORDER — METOPROLOL TARTRATE 12.5 MG HALF TABLET
12.5000 mg | ORAL_TABLET | Freq: Once | ORAL | Status: DC
  Filled 2020-07-06: qty 1

## 2020-07-06 MED ORDER — MIDAZOLAM HCL 5 MG/5ML IJ SOLN
INTRAMUSCULAR | Status: DC | PRN
  Administered 2020-07-06: 1 mg via INTRAVENOUS
  Administered 2020-07-06: 2 mg via INTRAVENOUS
  Administered 2020-07-06: 3 mg via INTRAVENOUS
  Administered 2020-07-06: 1 mg via INTRAVENOUS
  Administered 2020-07-06: 2 mg via INTRAVENOUS
  Administered 2020-07-06: 1 mg via INTRAVENOUS

## 2020-07-06 MED ORDER — ASPIRIN 81 MG PO CHEW: 324.0000 mg | CHEWABLE_TABLET | Freq: Every day | ORAL | Status: DC

## 2020-07-06 MED ORDER — ORAL CARE MOUTH RINSE
15.0000 mL | Freq: Two times a day (BID) | OROMUCOSAL | Status: DC
  Administered 2020-07-06 – 2020-07-10 (×6): 15 mL via OROMUCOSAL

## 2020-07-06 MED ORDER — POTASSIUM CHLORIDE 10 MEQ/50ML IV SOLN: 10.0000 meq | INTRAVENOUS | Status: AC

## 2020-07-06 MED ORDER — HEPARIN SODIUM (PORCINE) 1000 UNIT/ML IJ SOLN
INTRAMUSCULAR | Status: DC | PRN
  Administered 2020-07-06: 36000 [IU] via INTRAVENOUS

## 2020-07-06 MED ORDER — BISACODYL 5 MG PO TBEC
10.0000 mg | DELAYED_RELEASE_TABLET | Freq: Every day | ORAL | Status: DC
  Administered 2020-07-07 – 2020-07-09 (×3): 10 mg via ORAL
  Filled 2020-07-06 (×4): qty 2

## 2020-07-06 MED ORDER — ROCURONIUM BROMIDE 10 MG/ML (PF) SYRINGE
PREFILLED_SYRINGE | INTRAVENOUS | Status: AC
  Filled 2020-07-06: qty 20

## 2020-07-06 MED ORDER — PROTAMINE SULFATE 10 MG/ML IV SOLN
INTRAVENOUS | Status: DC | PRN
  Administered 2020-07-06: 300 mg via INTRAVENOUS

## 2020-07-06 MED ORDER — PHENYLEPHRINE 40 MCG/ML (10ML) SYRINGE FOR IV PUSH (FOR BLOOD PRESSURE SUPPORT)
PREFILLED_SYRINGE | INTRAVENOUS | Status: AC
  Filled 2020-07-06: qty 10

## 2020-07-06 MED ORDER — PROTAMINE SULFATE 10 MG/ML IV SOLN
INTRAVENOUS | Status: AC
  Filled 2020-07-06: qty 50

## 2020-07-06 MED ORDER — PANTOPRAZOLE SODIUM 40 MG PO TBEC
40.0000 mg | DELAYED_RELEASE_TABLET | Freq: Every day | ORAL | Status: DC
  Administered 2020-07-08 – 2020-07-09 (×2): 40 mg via ORAL
  Filled 2020-07-06 (×3): qty 1

## 2020-07-06 MED ORDER — SODIUM CHLORIDE 0.9% FLUSH
3.0000 mL | Freq: Two times a day (BID) | INTRAVENOUS | Status: DC
  Administered 2020-07-07: 3 mL via INTRAVENOUS

## 2020-07-06 MED ORDER — SODIUM CHLORIDE 0.9 % IV SOLN: INTRAVENOUS | Status: DC | PRN

## 2020-07-06 SURGICAL SUPPLY — 88 items
ADH SKN CLS APL DERMABOND .7 (GAUZE/BANDAGES/DRESSINGS) ×2
BAG DECANTER FOR FLEXI CONT (MISCELLANEOUS) ×3 IMPLANT
BLADE CLIPPER SURG (BLADE) ×3 IMPLANT
BLADE MINI RND TIP GREEN BEAV (BLADE) ×1 IMPLANT
BLADE STERNUM SYSTEM 6 (BLADE) ×3 IMPLANT
BLADE SURG 11 STRL SS (BLADE) ×1 IMPLANT
BNDG ELASTIC 4X5.8 VLCR STR LF (GAUZE/BANDAGES/DRESSINGS) ×3 IMPLANT
BNDG ELASTIC 6X5.8 VLCR STR LF (GAUZE/BANDAGES/DRESSINGS) ×3 IMPLANT
BNDG GAUZE ELAST 4 BULKY (GAUZE/BANDAGES/DRESSINGS) ×3 IMPLANT
CABLE SURGICAL S-101-97-12 (CABLE) ×3 IMPLANT
CANISTER SUCT 3000ML PPV (MISCELLANEOUS) ×3 IMPLANT
CANN PRFSN 3/8X14X24FR PCFC (MISCELLANEOUS) ×2
CANN PRFSN 3/8XCNCT ST RT ANG (MISCELLANEOUS) ×2
CANNULA DLP CORONARY OST 12FR (MISCELLANEOUS) ×1 IMPLANT
CANNULA NON VENT 22FR 12 (CANNULA) ×1 IMPLANT
CANNULA PRFSN 3/8X14X24FR PCFC (MISCELLANEOUS) IMPLANT
CANNULA PRFSN 3/8XCNCT RT ANG (MISCELLANEOUS) IMPLANT
CANNULA SUMP PERICARDIAL (CANNULA) ×1 IMPLANT
CANNULA VEN MTL TIP RT (MISCELLANEOUS) ×6
CATH ROBINSON RED A/P 18FR (CATHETERS) ×6 IMPLANT
CLIP RETRACTION 3.0MM CORONARY (MISCELLANEOUS) ×3 IMPLANT
CONN 1/2X1/2X1/2  BEN (MISCELLANEOUS) ×3
CONN 1/2X1/2X1/2 BEN (MISCELLANEOUS) IMPLANT
CONN 3/8X1/2 ST GISH (MISCELLANEOUS) ×2 IMPLANT
CONN 3/8X3/8 GISH STERILE (MISCELLANEOUS) ×1 IMPLANT
CONN ST 1/2X1/2  BEN (MISCELLANEOUS) ×3
CONN ST 1/2X1/2 BEN (MISCELLANEOUS) ×2 IMPLANT
CONNECTOR BLAKE 2:1 CARIO BLK (MISCELLANEOUS) ×3 IMPLANT
DERMABOND ADVANCED (GAUZE/BANDAGES/DRESSINGS) ×1
DERMABOND ADVANCED .7 DNX12 (GAUZE/BANDAGES/DRESSINGS) IMPLANT
DRAIN CHANNEL 19F RND (DRAIN) ×9 IMPLANT
DRAIN CONNECTOR BLAKE 1:1 (MISCELLANEOUS) ×3 IMPLANT
DRAPE CARDIOVASCULAR INCISE (DRAPES) ×3
DRAPE SLUSH/WARMER DISC (DRAPES) ×3 IMPLANT
DRAPE SRG 135X102X78XABS (DRAPES) ×2 IMPLANT
DRSG AQUACEL AG ADV 3.5X10 (GAUZE/BANDAGES/DRESSINGS) ×3 IMPLANT
ELECT BLADE 4.0 EZ CLEAN MEGAD (MISCELLANEOUS) ×3
ELECT REM PT RETURN 9FT ADLT (ELECTROSURGICAL) ×6
ELECTRODE BLDE 4.0 EZ CLN MEGD (MISCELLANEOUS) ×2 IMPLANT
ELECTRODE REM PT RTRN 9FT ADLT (ELECTROSURGICAL) ×4 IMPLANT
FELT TEFLON 1X6 (MISCELLANEOUS) ×6 IMPLANT
GAUZE SPONGE 4X4 12PLY STRL (GAUZE/BANDAGES/DRESSINGS) ×6 IMPLANT
GLOVE BIO SURGEON STRL SZ7 (GLOVE) ×6 IMPLANT
GLOVE BIOGEL M STRL SZ7.5 (GLOVE) ×6 IMPLANT
GOWN STRL REUS W/ TWL LRG LVL3 (GOWN DISPOSABLE) ×8 IMPLANT
GOWN STRL REUS W/ TWL XL LVL3 (GOWN DISPOSABLE) ×4 IMPLANT
GOWN STRL REUS W/TWL LRG LVL3 (GOWN DISPOSABLE) ×24
GOWN STRL REUS W/TWL XL LVL3 (GOWN DISPOSABLE) ×9
HEMOSTAT POWDER SURGIFOAM 1G (HEMOSTASIS) ×9 IMPLANT
INSERT SUTURE HOLDER (MISCELLANEOUS) ×3 IMPLANT
KIT BASIN OR (CUSTOM PROCEDURE TRAY) ×3 IMPLANT
KIT SUCTION CATH 14FR (SUCTIONS) ×3 IMPLANT
KIT TURNOVER KIT B (KITS) ×3 IMPLANT
KIT VASOVIEW HEMOPRO 2 VH 4000 (KITS) ×1 IMPLANT
LEAD PACING MYOCARDI (MISCELLANEOUS) ×3 IMPLANT
LOOP VESSEL SUPERMAXI WHITE (MISCELLANEOUS) ×1 IMPLANT
MARKER GRAFT CORONARY BYPASS (MISCELLANEOUS) ×8 IMPLANT
NS IRRIG 1000ML POUR BTL (IV SOLUTION) ×15 IMPLANT
PACK ACCESSORY CANNULA KIT (KITS) ×3 IMPLANT
PACK E OPEN HEART (SUTURE) ×3 IMPLANT
PACK OPEN HEART (CUSTOM PROCEDURE TRAY) ×3 IMPLANT
PAD ARMBOARD 7.5X6 YLW CONV (MISCELLANEOUS) ×6 IMPLANT
PAD ELECT DEFIB RADIOL ZOLL (MISCELLANEOUS) ×3 IMPLANT
PENCIL BUTTON HOLSTER BLD 10FT (ELECTRODE) ×3 IMPLANT
POSITIONER HEAD DONUT 9IN (MISCELLANEOUS) ×3 IMPLANT
PUNCH AORTIC ROTATE 4.0MM (MISCELLANEOUS) ×3 IMPLANT
SET CARDIOPLEGIA MPS 5001102 (MISCELLANEOUS) ×1 IMPLANT
SUPPORT HEART JANKE-BARRON (MISCELLANEOUS) ×3 IMPLANT
SUT BONE WAX W31G (SUTURE) ×3 IMPLANT
SUT ETHIBOND X763 2 0 SH 1 (SUTURE) ×6 IMPLANT
SUT MNCRL AB 3-0 PS2 18 (SUTURE) ×7 IMPLANT
SUT PDS AB 1 CTX 36 (SUTURE) ×6 IMPLANT
SUT PROLENE 4 0 RB 1 (SUTURE) ×18
SUT PROLENE 4 0 SH DA (SUTURE) ×7 IMPLANT
SUT PROLENE 4-0 RB1 .5 CRCL 36 (SUTURE) IMPLANT
SUT PROLENE 5 0 C 1 36 (SUTURE) ×9 IMPLANT
SUT PROLENE 7 0 BV1 MDA (SUTURE) ×3 IMPLANT
SUT STEEL STERNAL CCS#1 18IN (SUTURE) ×3 IMPLANT
SUT VIC AB 2-0 CT1 27 (SUTURE) ×3
SUT VIC AB 2-0 CT1 TAPERPNT 27 (SUTURE) IMPLANT
SYSTEM SAHARA CHEST DRAIN ATS (WOUND CARE) ×3 IMPLANT
TAPE PAPER 3X10 WHT MICROPORE (GAUZE/BANDAGES/DRESSINGS) ×1 IMPLANT
TOWEL GREEN STERILE (TOWEL DISPOSABLE) ×3 IMPLANT
TOWEL GREEN STERILE FF (TOWEL DISPOSABLE) ×3 IMPLANT
TRAY FOLEY SLVR 16FR TEMP STAT (SET/KITS/TRAYS/PACK) ×3 IMPLANT
TUBING LAP HI FLOW INSUFFLATIO (TUBING) ×4 IMPLANT
UNDERPAD 30X36 HEAVY ABSORB (UNDERPADS AND DIAPERS) ×3 IMPLANT
WATER STERILE IRR 1000ML POUR (IV SOLUTION) ×6 IMPLANT

## 2020-07-06 NOTE — Anesthesia Procedure Notes (Signed)
Central Venous Catheter Insertion Performed by: Murvin Natal, MD, anesthesiologist Start/End2/16/2022 8:00 AM, 07/06/2020 8:15 AM Patient location: Pre-op. Preanesthetic checklist: patient identified, IV checked, site marked, risks and benefits discussed, surgical consent, monitors and equipment checked, pre-op evaluation, timeout performed and anesthesia consent Position: Trendelenburg Lidocaine 1% used for infiltration and patient sedated Hand hygiene performed , maximum sterile barriers used  and Seldinger technique used Catheter size: 9 Fr Total catheter length 12. Central line was placed.MAC introducer Procedure performed using ultrasound guided technique. Ultrasound Notes:anatomy identified, needle tip was noted to be adjacent to the nerve/plexus identified, no ultrasound evidence of intravascular and/or intraneural injection and image(s) printed for medical record Attempts: 1 Following insertion, line sutured, dressing applied and Biopatch. Post procedure assessment: blood return through all ports, free fluid flow and no air  Patient tolerated the procedure well with no immediate complications.

## 2020-07-06 NOTE — Progress Notes (Signed)
  Echocardiogram Echocardiogram Transesophageal has been performed.  Delcie Roch 07/06/2020, 10:16 AM

## 2020-07-06 NOTE — Anesthesia Procedure Notes (Signed)
Arterial Line Insertion Start/End2/16/2022 7:00 AM, 07/06/2020 7:05 AM Performed by: Samara Deist, CRNA, CRNA  Patient location: Pre-op. Preanesthetic checklist: patient identified, IV checked, site marked, risks and benefits discussed, surgical consent, monitors and equipment checked, pre-op evaluation, timeout performed and anesthesia consent Lidocaine 1% used for infiltration radial was placed Catheter size: 20 Fr Hand hygiene performed  and maximum sterile barriers used   Attempts: 1 Procedure performed without using ultrasound guided technique. Following insertion, dressing applied. Post procedure assessment: normal and unchanged  Patient tolerated the procedure well with no immediate complications. Additional procedure comments: Performed by Blake Divine Rickerts, SRNA.

## 2020-07-06 NOTE — Op Note (Addendum)
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 Post-op Dx: Same Procedure: Aortic root exploration Partial unroofing of the left coronary artery Resuspension of the left to right commissure Coronary artery bypass grafting x2 with LIMA to LAD, and reverse saphenous vein graft to OM1.   Surgeon and Role:      * Vineet Kinney, Eliezer Lofts, MD - Primary    Webb Laws, PA-C- assisting  Anesthesia  general EBL: 800 ml Blood Administration: None Xclamp Time: 2 hours and 10 minutes  Pump Time: 151 min  Drains: 19 F blake drain: L, mediastinal  Wires: None Counts: correct   Indications: 45 year old male that originally presented to the hospital with new onset chest pain.  He was evaluated in emergency department and noted to have diffuse ST elevations.  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  He underwent a stress test as an inpatient and this was found to be negative thus he was discharged and followed up in clinic.  Since his clinic appointment he did endorse some similar chest pain that brought him to the emergency department.  The risks and benefits of coronary unroofing were discussed and he was agreeable to proceed  Findings: The AP window was opened and we were able to identify the left Neri artery.  He was taking a normal course behind the pulmonary artery.  After the cross-clamp was applied we created aortotomy for further evaluation of the origin of the left coronary artery.  It was located on the right coronary sinus, but it was immediately behind the left/right commissure.  The left coronary ostium was probed and it appeared to be coming right out of the aorta without tracking intramurally.  I began by unroofing part of the ostium which required  mobilization of the left/right commissure but after this was mobilized it was clear that the coronary did not take an intramural course.  Based on its proximity to the commissure it would not be possible to reimplant the left coronary ostium with a valve sparing root, as there would not be enough aortic tissue to sew the commissure.  I did not think that a Bentall procedure with mechanical prosthesis will be warranted, thus I elected to perform coronary artery bypass grafting to the LAD and circumflex system.  1 reconstruction of the commissure to the aortic wall was evident that the ostium would be stenosed, thus I felt that this would be the best plan.  Operative Technique: All invasive lines were placed in pre-op holding.  After the risks, benefits and alternatives were thoroughly discussed, the patient was brought to the operative theatre.  Anesthesia was induced, and the patient was prepped and draped in normal sterile fashion.  An appropriate surgical pause was performed, and pre-operative antibiotics were dosed accordingly.  We began with an incision over the chest for the sternotomy.  This was carried down with bovie cautery, and the sternum was divided with a reciprocating saw.  Meticulous hemostasis was obtained.  The patient was systemically heparinized.  The sternal retractor was placed.  The pericardium was divided in the midline and fashioned into a cradle with pericardial stitches.   After we confirmed an appropriate ACT, the ascending aorta was cannulated in standard fashion.  Bicaval venous cannulation was performed in case the pulmonary artery had to be reconstructed.  Cardiopulmonary bypass was initiated and we began to cool the patient to 32 degrees.  An LV vent was placed through the right superior pulmonary vein.  We began to develop the aortopulmonary window and identified the course of the left coronary artery.  It tracked posterior to the pulmonary vein but was located anterior with  the aorta.  The cross clamp was applied, and a dose of anterograde cardioplegia was given with good arrest of the heart.  Our aortotomy was made and directed toward the non coronary cusp.  The root was inspected and the ostium to the left coronary artery was identified on the right coronary sinus very close to the right left commissure.  We began to unroofed the left coronary ostium which required mobilization of the left right commissure, but it was evident that the artery did not track intramurally.  Given its proximity to the commissure it would not be possible to reimplant the coronary button and a more normal location without compromising the aortic valve.  At this point I elected to reattach the left right commissure and proceed with a two-vessel coronary artery bypass graft.    The sternal retractor was carefully removed and a sternal elevator was placed to expose the left internal thoracic artery.  This was taken as a pedicle while endoscopic vein harvesting of the right greater saphenous vein was performed.  We then identified a good target on the LAD very proximally on the vessel and created an arteriotomy.  The LIMA was sewn to this in an end-to-side fashion with 7-0 Prolene.  Next we identified a good target on the circumflex system, and created an end-to-side anastomosis between it and vein graft with 7-0 Prolene as well.  We began to rewarm, and close our aortotomy in 2 layers.  Another aortotomy was created to sew the proximal end of the vein graft to the aorta.  It was marked with the right.  A re-animation dose of cardioplegia was given.  After de-airing the heart, the aortic cross clamp was removed.  We checked our valve function, and for air using the TEE.  Once we were satisfying, we separated from cardiopulmonary bypass without event.    The heparin was reversed with protamine, and hemostasis was obtained.  Chest tubes and wires were placed, and the sternum was re-approximated with with  sternal wires.  The soft tissue and skin were re-approximated wth absorbable suture.    The patient tolerated the procedure without any immediate complications, and was transferred to the ICU in guarded condition.  Kerby Borner Keane Scrape

## 2020-07-06 NOTE — Anesthesia Postprocedure Evaluation (Signed)
Anesthesia Post Note  Patient: LERAY GARVERICK  Procedure(s) Performed: CORONARY ARTERY BYPASS GRAFTING (CABG) TIMES TWO USING LEFT INTERNAL MAMMARY ARTERY AND RIGHT GREATER SAPHENOUS VEIN HARVESTED ENDOSCOPICALLY, CORONARY UNROOFING WITH RESUSPENSION OF THE AORTIC VALVE (N/A Chest) TRANSESOPHAGEAL ECHOCARDIOGRAM (TEE) (N/A )     Patient location during evaluation: SICU Anesthesia Type: General Level of consciousness: sedated Pain management: pain level controlled Vital Signs Assessment: post-procedure vital signs reviewed and stable Respiratory status: patient remains intubated per anesthesia plan Cardiovascular status: stable Postop Assessment: no apparent nausea or vomiting Anesthetic complications: no   No complications documented.  Last Vitals:  Vitals:   07/06/20 1445 07/06/20 1500  BP:  106/78  Pulse: 80 80  Resp: 17 16  Temp: 36.6 C 36.6 C  SpO2: 99% 100%    Last Pain:  Vitals:   07/06/20 0713  TempSrc:   PainSc: 0-No pain                 Ameia Morency P Gonzalo Waymire

## 2020-07-06 NOTE — H&P (Signed)
[address]       [place] [address]             [phone]       Kevin Combs presents for surgery today.  He has had some occasional chest discomfort since his last clinic appointment.  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   Kevin Combs  Per my last clinic note  [facility] Medical Record #[id number] Date of Birth: 1976/05/10  Referring: Kevin Poisson, MD Primary Care: Patient, No Pcp Per Primary Cardiologist: Kevin Poisson, MD  Chief Complaint:        Chief Complaint  Patient presents with  . Follow-up    F/u from hospital consult for anomalous left coronary artery     History of Present Illness:    Kevin Combs 45 y.o. male presents in follow-up after hospital consultation.  He originally presented with left arm and shoulder pain, and diffuse ST elevations concerning for pericarditis, and an elevated troponin.  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  Prior to being discharged he underwent a stress test which was negative for any ischemia, and he has a very active lifestyle.  Complete football in college currently works for [employer] and [employer].  He currently is asymptomatic.  He denies any history of anginal symptoms.  He does occasionally have some indigestion with red meat.    No past medical history on file.       Past Surgical History:  Procedure Laterality Date  . KNEE SURGERY Right 1992  . LAPAROSCOPIC APPENDECTOMY N/A 01/28/2015   Procedure: APPENDECTOMY LAPAROSCOPIC;  Surgeon: Claud Kelp, MD;  Location: [facility];  Service: General;  Laterality: N/A;         Family History  Problem Relation Age of Onset   . Hypertension Mother   . Crohn's disease Daughter      Social History       Tobacco Use  Smoking Status Former Smoker  . Types: Cigars  Smokeless Tobacco Never Used    Social History       Substance and Sexual Activity  Alcohol Use Yes   Comment: social     No Known Allergies        Current Outpatient Medications  Medication Sig Dispense Refill  . acetaminophen (TYLENOL) 500 MG tablet Take 1,000 mg by mouth every 6 (six) hours as needed (for pain).    Marland Kitchen amLODipine (NORVASC) 10 MG tablet Take 1 tablet (10 mg total) by mouth daily. 30 tablet 0  . carvedilol (COREG) 12.5 MG tablet Take 1 tablet (12.5 mg total) by mouth 2 (two) times daily with a meal. 60 tablet 0  . colchicine 0.6 MG tablet Take 1 tablet (0.6 mg total) by mouth 2 (two) times daily. 28 tablet 0  . cyclobenzaprine (FLEXERIL) 5 MG tablet Take 1 tablet (5 mg total) by mouth 3 (three) times daily as needed for muscle spasms (shoulder pain). 30 tablet 0  . ibuprofen (ADVIL) 800 MG tablet Take 1 tablet (800 mg total) by mouth 3 (three) times daily. 42 tablet 0   No current facility-administered medications for this  visit.    Review of Systems  Gastrointestinal: Positive for heartburn.  Musculoskeletal: Positive for joint pain and myalgias.  All other systems reviewed and are negative.   PHYSICAL EXAMINATION: BP 133/89 (BP Location: Left Arm, Patient Position: Sitting)   Pulse 63   Resp 20   Ht 5\' 11"  (1.803 m)   Wt 215 lb (97.5 kg)   SpO2 98% Comment: RA with mask on  BMI 29.99 kg/m   Physical Exam Constitutional:      General: He is not in acute distress.    Appearance: Normal appearance. He is normal weight. He is not ill-appearing.  HENT:     Head: Normocephalic and atraumatic.  Eyes:     Extraocular Movements: Extraocular movements intact.  Cardiovascular:     Rate and Rhythm: Normal rate.  Pulmonary:     Effort: Pulmonary effort is normal. No respiratory distress.   Abdominal:     General: Abdomen is flat.  Musculoskeletal:        General: Normal range of motion.     Cervical back: Normal range of motion.  Skin:    General: Skin is warm and dry.  Neurological:     General: No focal deficit present.     Mental Status: He is alert and oriented to person, place, and time.      Diagnostic Studies & Laboratory data:     Recent Radiology Findings:    Imaging Results  DG Chest 2 View  Result Date: 05/09/2020 CLINICAL DATA:  Left arm pain which began 05/07/2020 has migrated into the chest. EXAM: CHEST - 2 VIEW COMPARISON:  PA and lateral chest 02/02/2013. FINDINGS: Lungs clear. Heart size normal. No pneumothorax or pleural fluid. No bony abnormality. IMPRESSION: Normal chest. Electronically Signed   By: 02/04/2013 M.D.   On: 05/09/2020 11:44   NM Myocar Multi W/Spect W/Wall Motion / EF  Result Date: 05/11/2020  Blood pressure demonstrated a normal response to exercise.  The study is normal.  This is a low risk study.  The left ventricular ejection fraction is mildly decreased (45-54%).  Normal resting and stress perfusion. No ischemia or infarction EF Estimated 46% but no RWMA and normal by TTE this admission   CT CORONARY MORPH W/CTA COR W/SCORE W/CA W/CM &/OR WO/CM  Addendum Date: 05/10/2020   ADDENDUM REPORT: 05/10/2020 15:50 HISTORY: Chest pain/anginal equiv, ECGs or troponins abnormal EXAM: Cardiac/Coronary  CT TECHNIQUE: The patient was scanned on a 05/12/2020. PROTOCOL: A 120 kV prospective scan was triggered in the descending thoracic aorta at 111 HU's. Axial non-contrast 3 mm slices were carried out through the heart. The data set was analyzed on a dedicated work station and scored using the Agatson method. Gantry rotation speed was 250 msecs and collimation was .6 mm. Beta blockade and 0.8 mg of sl NTG was given. The 3D data set was reconstructed in 5% intervals of the 67-82 % of the R-R cycle. Diastolic phases were  analyzed on a dedicated work station using MPR, MIP and VRT modes. The patient received 33mL OMNIPAQUE IOHEXOL 350 MG/ML SOLN of contrast. FINDINGS: Image quality: Good. Noise artifact is: Limited. Coronary calcium score is 0. Coronary arteries: Right dominance. 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 It takes an interarterial course between the aorta and pulmonary artery. Ostium is slit like appearance, and the body of the left main coronary artery may be partially intramural. There is mild systolic  compression of the left main coronary artery. The left main then trifurcates into the LAD, Ramus intermedius, and L circumflex arteries. The RCA has a normal origin and course, and is dominant. Right Coronary Artery: No detectable plaque or stenosis. Left Main Coronary Artery: No detectable plaque or stenosis. Left Anterior Descending Coronary Artery: No detectable plaque or stenosis. Ramus intermedius: No detectable plaque or stenosis. Left Circumflex Artery: No detectable plaque or stenosis. Aorta: Normal size, 35 mm at the mid ascending aorta (level of the PA bifurcation) measured double oblique. No calcifications. No dissection. Aortic Valve: No calcifications. Other findings: Normal pulmonary vein drainage into the left atrium. Normal left atrial appendage without a thrombus. Normal caliber main pulmonary artery. IMPRESSION: 1. 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 Ostium has slit-like appearance, and there is mild systolic compression of the body of the left main coronary artery. Right dominant circulation. CT FFR will be performed and reported separately for ischemic evaluation. 2.  No evidence of CAD, CADRADS = 0. 3. Coronary calcium score of 0. Electronically Signed   By: Weston Brass   On: 05/10/2020 15:50   Result Date: 05/10/2020 EXAM: OVER-READ  INTERPRETATION  CT CHEST The following report is an over-read performed by radiologist Dr. Jeronimo Greaves of [facility] on 05/10/2020. This over-read does not include interpretation of cardiac or coronary anatomy or pathology. The coronary CTA interpretation by the cardiologist is attached. COMPARISON:  Chest radiograph 1 day prior. FINDINGS: Vascular: Normal aortic caliber. No central pulmonary embolism, on this non-dedicated study. Mediastinum/Nodes: No imaged thoracic adenopathy. Lungs/Pleura: Trace left pleural fluid. Left base subsegmental atelectasis. Upper Abdomen: Normal imaged portions of the liver, spleen, stomach. Musculoskeletal: No acute osseous abnormality. IMPRESSION: Trace left pleural fluid and left base subsegmental atelectasis. Although no pulmonary embolism is identified on this nondedicated study, pleural fluid and atelectasis can be sitting in the setting of pulmonary emboli. If left-sided pulmonary embolism is a clinical concern, consider dedicated CTA of the chest. Electronically Signed: By: Jeronimo Greaves M.D. On: 05/10/2020 12:59   CT CORONARY FRACTIONAL FLOW RESERVE DATA PREP  Result Date: 05/11/2020 EXAM: CT FFR ANALYSIS CLINICAL DATA:  Anomalous left main coronary artery with interarterial course. FINDINGS: FFRct analysis was performed on the original cardiac CT angiogram dataset. Diagrammatic representation of the FFRct analysis is provided in a separate PDF document in PACS. This dictation was created using the PDF document and an interactive 3D model of the results. 3D model is not available in the EMR/PACS. Normal FFR range is >0.80. Indeterminate (grey) zone is 0.76-0.80. 1. Left Main: FFR = 0.96 2. LAD: Proximal FFR = 0.94, mid FFR = 0.87, Distal FFR = 0.78 3. Ramus intermedius = proximal 0.96, distal 0.94 4. LCX: Proximal FFR = 0.94, distal FFR = 0.92 5. RCA: Proximal FFR = 0.96, mid FFR =0.93, distal FFR = 0.91 IMPRESSION: 1. CT FFR analysis showed no significant  stenosis. No ischemia noted at rest with anomalous left main coronary artery with interarterial course. RECOMMENDATIONS: Consider exercise stress imaging to assess for ischemia. Electronically Signed   By: Weston Brass   On: 05/11/2020 13:06   ECHOCARDIOGRAM COMPLETE  Result Date: 05/09/2020    ECHOCARDIOGRAM REPORT   Patient Name:   DAMARKUS BALIS Dinapoli Date of Exam: 05/09/2020 Medical Rec #:  [id number]     Height:       71.0 in Accession #:    [id number]  Weight:       220.0 lb Date of Birth:  1975-12-25     BSA:          2.196 m Patient Age:    44 years      BP:           143/102 mmHg Patient Gender: M             HR:           88 bpm. Exam Location:  Inpatient Procedure: 2D Echo, Cardiac Doppler and Color Doppler                       STAT ECHO Reported to: Dr Italy Hilty on 05/09/2020 12:16:00 PM. Indications:    STEMI I21.3  History:        Patient has no prior history of Echocardiogram examinations.  Sonographer:    Elmarie Shiley Dance Referring Phys: [id number] MICHAEL COOPER IMPRESSIONS  1. Left ventricular ejection fraction, by estimation, is 55 to 60%. The left ventricle has normal function. The left ventricle has no regional wall motion abnormalities. There is mild left ventricular hypertrophy. Left ventricular diastolic parameters are consistent with Grade I diastolic dysfunction (impaired relaxation).  2. Right ventricular systolic function is normal. The right ventricular size is normal. Tricuspid regurgitation signal is inadequate for assessing PA pressure.  3. The mitral valve is abnormal. Trivial mitral valve regurgitation.  4. The aortic valve is tricuspid. Aortic valve regurgitation is not visualized.  5. The inferior vena cava is normal in size with greater than 50% respiratory variability, suggesting right atrial pressure of 3 mmHg. Comparison(s): No prior Echocardiogram. Conclusion(s)/Recommendation(s): Critical findings reported to Dr. Excell Seltzer and acknowledged at 12:24 pm on 05/09/20. FINDINGS  Left  Ventricle: Left ventricular ejection fraction, by estimation, is 55 to 60%. The left ventricle has normal function. The left ventricle has no regional wall motion abnormalities. The left ventricular internal cavity size was normal in size. There is  mild left ventricular hypertrophy. Left ventricular diastolic parameters are consistent with Grade I diastolic dysfunction (impaired relaxation). Normal left ventricular filling pressure. Right Ventricle: The right ventricular size is normal. No increase in right ventricular wall thickness. Right ventricular systolic function is normal. Tricuspid regurgitation signal is inadequate for assessing PA pressure. Left Atrium: Left atrial size was normal in size. Right Atrium: Right atrial size was normal in size. Pericardium: There is no evidence of pericardial effusion. Mitral Valve: The mitral valve is abnormal. There is mild thickening of the mitral valve leaflet(s). Trivial mitral valve regurgitation. Tricuspid Valve: The tricuspid valve is grossly normal. Tricuspid valve regurgitation is trivial. Aortic Valve: The aortic valve is tricuspid. Aortic valve regurgitation is not visualized. Pulmonic Valve: The pulmonic valve was normal in structure. Pulmonic valve regurgitation is mild. Aorta: The aortic root and ascending aorta are structurally normal, with no evidence of dilitation. Venous: The inferior vena cava is normal in size with greater than 50% respiratory variability, suggesting right atrial pressure of 3 mmHg. IAS/Shunts: There is right bowing of the interatrial septum, suggestive of elevated left atrial pressure. No atrial level shunt detected by color flow Doppler.  LEFT VENTRICLE PLAX 2D LVIDd:         4.60 cm  Diastology LVIDs:         3.40 cm  LV e' medial:    8.38 cm/s LV PW:         1.30 cm  LV E/e' medial:  6.6 LV IVS:  1.30 cm  LV e' lateral:   10.10 cm/s LVOT diam:     2.50 cm  LV E/e' lateral: 5.5 LV SV:         90 LV SV Index:   41 LVOT Area:      4.91 cm  RIGHT VENTRICLE             IVC RV Basal diam:  3.30 cm     IVC diam: 1.70 cm RV Mid diam:    2.70 cm RV S prime:     12.10 cm/s TAPSE (M-mode): 1.6 cm LEFT ATRIUM             Index       RIGHT ATRIUM           Index LA diam:        4.30 cm 1.96 cm/m  RA Area:     14.20 cm LA Vol (A2C):   57.9 ml 26.37 ml/m RA Volume:   31.80 ml  14.48 ml/m LA Vol (A4C):   42.4 ml 19.31 ml/m LA Biplane Vol: 50.2 ml 22.86 ml/m  AORTIC VALVE LVOT Vmax:   101.00 cm/s LVOT Vmean:  62.500 cm/s LVOT VTI:    0.184 m  AORTA Ao Root diam: 3.50 cm Ao Asc diam:  3.70 cm MITRAL VALVE MV Area (PHT): 2.73 cm    SHUNTS MV Decel Time: 278 msec    Systemic VTI:  0.18 m MV E velocity: 55.30 cm/s  Systemic Diam: 2.50 cm MV A velocity: 66.00 cm/s MV E/A ratio:  0.84 Zoila ShutterKenneth Hilty MD Electronically signed by Zoila ShutterKenneth Hilty MD Signature Date/Time: 05/09/2020/12:26:30 PM    Final         I have independently reviewed the above radiology studies  and reviewed the findings with the patient.   Recent Lab Findings: Recent Labs       Lab Results  Component Value Date   WBC 6.4 05/10/2020   HGB 14.8 05/10/2020   HCT 42.8 05/10/2020   PLT 186 05/10/2020   GLUCOSE 81 05/11/2020   CHOL 191 05/10/2020   TRIG 52 05/10/2020   HDL 76 05/10/2020   LDLCALC 105 (H) 05/10/2020   ALT 30 05/11/2020   AST 27 05/11/2020   NA 137 05/11/2020   K 3.6 05/11/2020   CL 101 05/11/2020   CREATININE 1.01 05/11/2020   BUN 17 05/11/2020   CO2 23 05/11/2020   HGBA1C 5.5 05/09/2020          Assessment / Plan:   [age] year old male with coronary artery that presents for surgical evaluation.  In clinic today was happy to speak with his wife, and his mother and sister via FaceTime.  We discussed the risks and benefits of proceeding with surgical correction of this anomalous left coronary.  We discussed the option of a coronary unroofing, versus reimplantation, versus coronary artery bypass grafting.  We also  discussed the risks of medically managing this discovery which included ongoing anginal symptoms, arrhythmias, and sudden death.  Even though he is asymptomatic and given the course of the left coronary artery he is at much higher risk of sudden cardiac death.  I recommended that he undergo coronary unroofing, and he is 90% certain that he would proceed with the surgery but would like to get some financial concerns in order.  Warning signs for ongoing management of atypical chest pain were also discussed and he was instructed to come immediately to the emergency department if he has any other symptoms.

## 2020-07-06 NOTE — Anesthesia Procedure Notes (Signed)
Procedure Name: Intubation Date/Time: 07/06/2020 8:49 AM Performed by: Valda Favia, CRNA Pre-anesthesia Checklist: Patient identified, Emergency Drugs available, Suction available and Patient being monitored Patient Re-evaluated:Patient Re-evaluated prior to induction Oxygen Delivery Method: Circle System Utilized Preoxygenation: Pre-oxygenation with 100% oxygen Induction Type: IV induction Ventilation: Mask ventilation without difficulty Laryngoscope Size: Mac and 4 Grade View: Grade II Tube type: Oral Tube size: 8.0 mm Number of attempts: 1 Airway Equipment and Method: Stylet Placement Confirmation: ETT inserted through vocal cords under direct vision,  positive ETCO2 and breath sounds checked- equal and bilateral Secured at: 23 cm Tube secured with: Tape Dental Injury: Teeth and Oropharynx as per pre-operative assessment  Comments: Performed by Sueanne Margarita, SRNA

## 2020-07-06 NOTE — Plan of Care (Signed)
  Problem: Clinical Measurements: Goal: Respiratory complications will improve Outcome: Progressing Goal: Cardiovascular complication will be avoided Outcome: Progressing   Problem: Coping: Goal: Level of anxiety will decrease Outcome: Progressing   Problem: Elimination: Goal: Will not experience complications related to urinary retention Outcome: Progressing   Problem: Pain Managment: Goal: General experience of comfort will improve Outcome: Progressing   Problem: Safety: Goal: Ability to remain free from injury will improve Outcome: Progressing   Problem: Skin Integrity: Goal: Risk for impaired skin integrity will decrease Outcome: Progressing   Problem: Cardiac: Goal: Will achieve and/or maintain hemodynamic stability Outcome: Progressing   Problem: Clinical Measurements: Goal: Postoperative complications will be avoided or minimized Outcome: Progressing   Problem: Respiratory: Goal: Respiratory status will improve Outcome: Progressing   Problem: Urinary Elimination: Goal: Ability to achieve and maintain adequate renal perfusion and functioning will improve Outcome: Progressing

## 2020-07-06 NOTE — Transfer of Care (Signed)
Immediate Anesthesia Transfer of Care Note  Patient: Kevin Combs  Procedure(s) Performed: CORONARY ARTERY BYPASS GRAFTING (CABG) TIMES TWO USING LEFT INTERNAL MAMMARY ARTERY AND RIGHT GREATER SAPHENOUS VEIN HARVESTED ENDOSCOPICALLY, CORONARY UNROOFING WITH RESUSPENSION OF THE AORTIC VALVE (N/A Chest) TRANSESOPHAGEAL ECHOCARDIOGRAM (TEE) (N/A )  Patient Location: ICU  Anesthesia Type:General  Level of Consciousness: sedated and Patient remains intubated per anesthesia plan  Airway & Oxygen Therapy: Patient remains intubated per anesthesia plan and Patient placed on Ventilator (see vital sign flow sheet for setting)  Post-op Assessment: Report given to RN and Post -op Vital signs reviewed and stable  Post vital signs: Reviewed and stable  Last Vitals:  Vitals Value Taken Time  BP 102/72 07/06/20 1422  Temp 36.5 C 07/06/20 1428  Pulse 81 07/06/20 1428  Resp 17 07/06/20 1428  SpO2 97 % 07/06/20 1428  Vitals shown include unvalidated device data.  Last Pain:  Vitals:   07/06/20 0713  TempSrc:   PainSc: 0-No pain         Complications: No complications documented.

## 2020-07-06 NOTE — Progress Notes (Signed)
      [address]       [name] [address]             [phone]      S/p CABG x 2  Intubated, sedated, starting to wean  BP 101/76   Pulse 87   Temp 99.68 F (37.6 C)   Resp 20   Ht 5\' 11"  (1.803 m)   Wt 99.8 kg   SpO2 100%   BMI 30.68 kg/m  Co= 4.3 index 1.9, CVp 14  Intake/Output Summary (Last 24 hours) at 07/06/2020 1753 Last data filed at 07/06/2020 1700 Gross per 24 hour  Intake 3995.43 ml  Output 3193 ml  Net 802.43 ml   Hct= 30, K= 4.0  Doing well early postop  07/08/2020 C. Viviann Spare, MD [facility] [phone]

## 2020-07-06 NOTE — Op Note (Incomplete Revision)
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 Post-op Dx: Same Procedure: Aortic root exploration Partial unroofing of the left coronary artery Resuspension of the left to right commissure Coronary artery bypass grafting x2 with LIMA to LAD, and reverse saphenous vein graft to OM1.   Surgeon and Role:      * Hollis Tuller, Eliezer Lofts, MD - Primary    Webb Laws, PA-C- assisting  Anesthesia  general EBL: 800 ml Blood Administration: None Xclamp Time: 2 hours and 10 minutes  Pump Time: 151 min  Drains: 19 F blake drain: L, mediastinal  Wires: None Counts: correct   Indications: 45 year old male that originally presented to the hospital with new onset chest pain.  He was evaluated in emergency department and noted to have diffuse ST elevations.  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  He underwent a stress test as an inpatient and this was found to be negative thus he was discharged and followed up in clinic.  Since his clinic appointment he did endorse some similar chest pain that brought him to the emergency department.  The risks and benefits of coronary unroofing were discussed and he was agreeable to proceed  Findings: Left coronary ostium seen directly under the left-right commisure on the right coronary cusp.  I isolate the left coronary in the AP window, and exited the aorta immediately next to the commisure, without tracking intra-murally.  I attempted to unroof the ostium by taking down the commisure, but since it did not track along the wall into the left coronary sinus, reconstruction on the commisure would result in a greater stenosis.  Given it location, a valve sparing root would not be possible, and I did not feel that a Bental with a  mechanical prosthesis in a 45 yo was warranted, I elected to perform a 2 vessel bypass with the LIMA to LAD, and saphenous vein to OM.  Operative Technique: All invasive lines were placed in pre-op holding.  After the risks, benefits and alternatives were thoroughly discussed, the patient was brought to the operative theatre.  Anesthesia was induced, and the patient was prepped and draped in normal sterile fashion.  An appropriate surgical pause was performed, and pre-operative antibiotics were dosed accordingly.  We began with an incision over the chest for the sternotomy.  This was carried down with bovie cautery, and the sternum was divided with a reciprocating saw.  Meticulous hemostasis was obtained.  The patient was systemically heparinized.   The sternal retractor was placed.  The pericardium was divided in the midline and fashioned into a cradle with pericardial stitches.   After we confirmed an appropriate ACT, the ascending aorta was cannulated in standard fashion.  The right atrial appendage was used for venous cannulation site.  Cardiopulmonary bypass was initiated and we began to cool the patient to 32 degrees. The cross clamp was applied, and a dose of anterograde cardioplegia was given with good arrest of the heart.  Our aortotomy was made and directed toward the  non coronary cusp.  The valve was inspected.  ***.  All leaflets were excised.  The annulus was sized to a ***.  The left ventricle was then copiously irrigated.  Pledgeted mattress sutures were placed circumferentially through the annulus.  These sutures were then passed through the sewing ring of the valve.  Once the valve was seated in the annulus, it was secured with Core-knot sutures.  We began to rewarm, and close our aortotomy in 2 layers.  A re-animation dose of cardioplegia was given.  After de-airing the heart, the aortic cross clamp was removed.  We checked our valve function, and for air using the TEE.  Once we were  satisfying, we separated from cardiopulmonary bypass without event.    The heparin was reversed with protamine, and hemostasis was obtained.  Chest tubes and wires were placed, and the sternum was re-approximated with with sternal wires.  The soft tissue and skin were re-approximated wth absorbable suture.    The patient tolerated the procedure without any immediate complications, and was transferred to the ICU in guarded condition.  Kevin Combs O Kevin Combs  ```

## 2020-07-06 NOTE — Brief Op Note (Signed)
07/06/2020  1:57 PM  PATIENT:  Kevin Combs  45 y.o. male  PRE-OPERATIVE DIAGNOSIS:  LEFT CORONARY ARTERY ANOMOLY  POST-OPERATIVE DIAGNOSIS:  LEFT CORONARY ARTERY ANOMOLY  PROCEDURE:  Procedure(s): CORONARY ARTERY BYPASS GRAFTING (CABG) TIMES TWO USING LEFT INTERNAL MAMMARY ARTERY AND RIGHT GREATER SAPHENOUS VEIN HARVESTED ENDOSCOPICALLY, CORONARY UNROOFING WITH RESUSPENSION OF THE AORTIC VALVE (N/A) TRANSESOPHAGEAL ECHOCARDIOGRAM (TEE) (N/A) LIMA-LAD SVG-CIRCUMFLEX EVH 25 MINUTES   SURGEON:  Surgeon(s) and Role:    * Lightfoot, Eliezer Lofts, MD - Primary  PHYSICIAN ASSISTANT: Josias Tomerlin PA-C  ASSISTANTS: STAFF   ANESTHESIA:   general  EBL:  868 mL   BLOOD ADMINISTERED:none  DRAINS: MEDIASTINAL AND LEFT PLEURAL CHEST TUBES   LOCAL MEDICATIONS USED:  NONE  SPECIMEN:  No Specimen  DISPOSITION OF SPECIMEN:  N/A  COUNTS:  YES  TOURNIQUET:  * No tourniquets in log *  DICTATION: .Dragon Dictation  PLAN OF CARE: Admit to inpatient   PATIENT DISPOSITION:  ICU - intubated and hemodynamically stable.   Delay start of Pharmacological VTE agent (>24hrs) due to surgical blood loss or risk of bleeding: yes  COMPLICATIONS: NO KNOWN

## 2020-07-06 NOTE — Procedures (Signed)
Extubation Procedure Note  Patient Details:   Name: Kevin Combs DOB: Oct 15, 1975 MRN: [id number]   Airway Documentation:    Vent end date: 07/06/20 Vent end time: 1812   Evaluation  O2 sats: stable throughout Complications: No apparent complications Patient did tolerate procedure well. Bilateral Breath Sounds: Clear,Diminished   Yes   Patient was able to perform a VC of 680 ml's and a NIF of -25 with good effort. Patient extubated to a 4L [place]. Cuff leak was heard. No stridor noted. Wife and RN at bedside with RT during extubation. Pt tolerated well.  Darolyn Rua 07/06/2020, 6:15 PM

## 2020-07-06 NOTE — Anesthesia Postprocedure Evaluation (Deleted)
Anesthesia Post Note  Patient: Kevin Combs  Procedure(s) Performed: CORONARY ARTERY BYPASS GRAFTING (CABG) TIMES TWO USING LEFT INTERNAL MAMMARY ARTERY AND RIGHT GREATER SAPHENOUS VEIN HARVESTED ENDOSCOPICALLY, CORONARY UNROOFING WITH RESUSPENSION OF THE AORTIC VALVE (N/A Chest) TRANSESOPHAGEAL ECHOCARDIOGRAM (TEE) (N/A )     Anesthesia Post Evaluation No complications documented.  Last Vitals:  Vitals:   07/06/20 0700  BP: (!) 136/91  Pulse: 65  Resp: 17  Temp: 36.9 C  SpO2: 100%    Last Pain:  Vitals:   07/06/20 0713  TempSrc:   PainSc: 0-No pain                 Hawraa Stambaugh B

## 2020-07-07 ENCOUNTER — Encounter (HOSPITAL_COMMUNITY): Payer: Self-pay | Admitting: Thoracic Surgery (Cardiothoracic Vascular Surgery)

## 2020-07-07 ENCOUNTER — Inpatient Hospital Stay (HOSPITAL_COMMUNITY): Payer: 59

## 2020-07-07 LAB — BASIC METABOLIC PANEL
Anion gap: 6 (ref 5–15)
Anion gap: 7 (ref 5–15)
BUN: 10 mg/dL (ref 6–20)
BUN: 14 mg/dL (ref 6–20)
CO2: 24 mmol/L (ref 22–32)
CO2: 26 mmol/L (ref 22–32)
Calcium: 8 mg/dL — ABNORMAL LOW (ref 8.9–10.3)
Calcium: 8.1 mg/dL — ABNORMAL LOW (ref 8.9–10.3)
Chloride: 106 mmol/L (ref 98–111)
Chloride: 103 mmol/L (ref 98–111)
Creatinine, Ser: 0.88 mg/dL (ref 0.61–1.24)
Creatinine, Ser: 1.12 mg/dL (ref 0.61–1.24)
GFR, Estimated: >60 mL/min (ref >60)
GFR, Estimated: >60 mL/min (ref >60)
Glucose, Bld: 116 mg/dL — ABNORMAL HIGH (ref 70–99)
Glucose, Bld: 114 mg/dL — ABNORMAL HIGH (ref 70–99)
Potassium: 4 mmol/L (ref 3.5–5.1)
Potassium: 3.9 mmol/L (ref 3.5–5.1)
Sodium: 136 mmol/L (ref 135–145)
Sodium: 136 mmol/L (ref 135–145)

## 2020-07-07 LAB — GLUCOSE, CAPILLARY
Glucose-Capillary: 100 mg/dL — ABNORMAL HIGH (ref 70–99)
Glucose-Capillary: 102 mg/dL — ABNORMAL HIGH (ref 70–99)
Glucose-Capillary: 102 mg/dL — ABNORMAL HIGH (ref 70–99)
Glucose-Capillary: 105 mg/dL — ABNORMAL HIGH (ref 70–99)
Glucose-Capillary: 106 mg/dL — ABNORMAL HIGH (ref 70–99)
Glucose-Capillary: 109 mg/dL — ABNORMAL HIGH (ref 70–99)
Glucose-Capillary: 112 mg/dL — ABNORMAL HIGH (ref 70–99)
Glucose-Capillary: 122 mg/dL — ABNORMAL HIGH (ref 70–99)
Glucose-Capillary: 86 mg/dL (ref 70–99)
Glucose-Capillary: 96 mg/dL (ref 70–99)
Glucose-Capillary: 99 mg/dL (ref 70–99)

## 2020-07-07 LAB — CBC
HCT: 29.6 % — ABNORMAL LOW (ref 39.0–52.0)
HCT: 30.8 % — ABNORMAL LOW (ref 39.0–52.0)
Hemoglobin: 10.4 g/dL — ABNORMAL LOW (ref 13.0–17.0)
Hemoglobin: 10.4 g/dL — ABNORMAL LOW (ref 13.0–17.0)
MCH: 29.8 pg (ref 26.0–34.0)
MCH: 29.2 pg (ref 26.0–34.0)
MCHC: 35.1 g/dL (ref 30.0–36.0)
MCHC: 33.8 g/dL (ref 30.0–36.0)
MCV: 84.8 fL (ref 80.0–100.0)
MCV: 86.5 fL (ref 80.0–100.0)
Platelets: 135 10*3/uL — ABNORMAL LOW (ref 150–400)
Platelets: 127 10*3/uL — ABNORMAL LOW (ref 150–400)
RBC: 3.49 MIL/uL — ABNORMAL LOW (ref 4.22–5.81)
RBC: 3.56 MIL/uL — ABNORMAL LOW (ref 4.22–5.81)
RDW: 13.3 % (ref 11.5–15.5)
RDW: 13.4 % (ref 11.5–15.5)
WBC: 8.3 10*3/uL (ref 4.0–10.5)
WBC: 9.5 10*3/uL (ref 4.0–10.5)
nRBC: 0 % (ref 0.0–0.2)
nRBC: 0 % (ref 0.0–0.2)

## 2020-07-07 LAB — MAGNESIUM
Magnesium: 2.5 mg/dL — ABNORMAL HIGH (ref 1.7–2.4)
Magnesium: 2.7 mg/dL — ABNORMAL HIGH (ref 1.7–2.4)

## 2020-07-07 MED ORDER — METHOCARBAMOL 500 MG PO TABS
500.0000 mg | ORAL_TABLET | Freq: Three times a day (TID) | ORAL | Status: DC
  Administered 2020-07-07 – 2020-07-09 (×9): 500 mg via ORAL
  Filled 2020-07-07 (×10): qty 1

## 2020-07-07 MED ORDER — LIDOCAINE 5 % EX PTCH
2.0000 | MEDICATED_PATCH | CUTANEOUS | Status: DC
  Administered 2020-07-07 – 2020-07-09 (×3): 2 via TRANSDERMAL
  Filled 2020-07-07 (×4): qty 2

## 2020-07-07 MED ORDER — INSULIN ASPART 100 UNIT/ML ~~LOC~~ SOLN: 0.0000 [IU] | SUBCUTANEOUS | Status: DC

## 2020-07-07 MED ORDER — SODIUM CHLORIDE 0.9% FLUSH
3.0000 mL | Freq: Two times a day (BID) | INTRAVENOUS | Status: DC
  Administered 2020-07-07: 3 mL via INTRAVENOUS

## 2020-07-07 MED ORDER — SODIUM CHLORIDE 0.9 % IV SOLN: 250.0000 mL | INTRAVENOUS | Status: DC | PRN

## 2020-07-07 MED ORDER — ~~LOC~~ CARDIAC SURGERY, PATIENT & FAMILY EDUCATION: Freq: Once | Status: AC

## 2020-07-07 MED ORDER — KETOROLAC TROMETHAMINE 30 MG/ML IJ SOLN
30.0000 mg | Freq: Four times a day (QID) | INTRAMUSCULAR | Status: AC
  Administered 2020-07-07 – 2020-07-09 (×9): 30 mg via INTRAVENOUS
  Filled 2020-07-07 (×10): qty 1

## 2020-07-07 MED ORDER — SODIUM CHLORIDE 0.9% FLUSH: 3.0000 mL | INTRAVENOUS | Status: DC | PRN

## 2020-07-07 MED ORDER — ENOXAPARIN SODIUM 40 MG/0.4ML ~~LOC~~ SOLN
40.0000 mg | Freq: Every day | SUBCUTANEOUS | Status: DC
  Filled 2020-07-07: qty 0.4

## 2020-07-07 MED FILL — Sodium Chloride IV Soln 0.9%: INTRAVENOUS | Qty: 2000 | Status: AC

## 2020-07-07 MED FILL — Calcium Chloride Inj 10%: INTRAVENOUS | Qty: 10 | Status: AC

## 2020-07-07 MED FILL — Potassium Chloride Inj 2 mEq/ML: INTRAVENOUS | Qty: 40 | Status: AC

## 2020-07-07 MED FILL — Lidocaine HCl Local Preservative Free (PF) Inj 2%: INTRAMUSCULAR | Qty: 15 | Status: AC

## 2020-07-07 MED FILL — Heparin Sodium (Porcine) Inj 1000 Unit/ML: INTRAMUSCULAR | Qty: 10 | Status: AC

## 2020-07-07 MED FILL — Lidocaine HCl Local Soln Prefilled Syringe 100 MG/5ML (2%): INTRAMUSCULAR | Qty: 5 | Status: AC

## 2020-07-07 MED FILL — Electrolyte-R (PH 7.4) Solution: INTRAVENOUS | Qty: 4000 | Status: AC

## 2020-07-07 MED FILL — Mannitol IV Soln 20%: INTRAVENOUS | Qty: 500 | Status: AC

## 2020-07-07 MED FILL — Heparin Sodium (Porcine) Inj 1000 Unit/ML: INTRAMUSCULAR | Qty: 30 | Status: AC

## 2020-07-07 MED FILL — Sodium Bicarbonate IV Soln 8.4%: INTRAVENOUS | Qty: 50 | Status: AC

## 2020-07-07 MED FILL — Albumin, Human Inj 5%: INTRAVENOUS | Qty: 250 | Status: AC

## 2020-07-07 NOTE — Hospital Course (Signed)
  History of the present illness: The patient is a 45 year old male who originally presented to the hospital with new onset chest pain.  He underwent evaluation in the emergency department where he was noted to have diffuse ST elevations.  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  He is undergone full cardiology evaluation including stress test which was negative and he was followed up in the clinic.  He was referred in cardiothoracic surgical consultation to Dr. Cliffton Asters.  The patient and all relevant studies were evaluated and he was felt to be a candidate for surgical repair and was admitted electively for the procedure.  Hospital course the patient was taken the operating room on 07/06/2020 at which time he underwent aortic root exploration with partial unroofing of the left coronary artery.  Due to the intraoperative findings it was determined that the best course of action would be to proceed with CABG x2 with grafts placed 1.  LIMA to LAD and #2.  Reverse saphenous vein graft to the obtuse marginal #1.  He tolerated this procedure well and was taken to the surgical intensive care unit in stable condition.  Postoperative hospital course:  The patient was extubated using standard post cardiac surgical protocols.  He has remained neurologically intact.  He is in a normal sinus rhythm and has been started on aspirin statin and beta-blocker.  All routine lines, monitors and drainage devices were removed in the usual manner.  He does have an expected acute blood loss anemia with postop day 1 hemoglobin hematocrit 10.4/29.6 respectively.  He has good urine output and renal function is within normal limits.  Blood sugars have been under adequate control using standard protocols.

## 2020-07-07 NOTE — Progress Notes (Signed)
Patient ambulated from bed to restroom and back to bed with standby assist

## 2020-07-07 NOTE — Progress Notes (Signed)
Patient transferred to 4E27 on tele with belongings. Marchelle Folks RN to receive patient. Wife at bedside.

## 2020-07-07 NOTE — Progress Notes (Signed)
Patient ambulated to the restroom and back to bed, RN standby assist.  Patient with no complaints  RN will continue to monitor patient

## 2020-07-07 NOTE — Progress Notes (Signed)
      [address]       [place] [id number]             [phone]                 1 Day Post-Op Procedure(s) (LRB): CORONARY ARTERY BYPASS GRAFTING (CABG) TIMES TWO USING LEFT INTERNAL MAMMARY ARTERY AND RIGHT GREATER SAPHENOUS VEIN HARVESTED ENDOSCOPICALLY, CORONARY UNROOFING WITH RESUSPENSION OF THE AORTIC VALVE (N/A) TRANSESOPHAGEAL ECHOCARDIOGRAM (TEE) (N/A)   Events: No events _______________________________________________________________ Vitals: BP 105/71   Pulse 78   Temp (!) 100.8 F (38.2 C)   Resp (!) 26   Ht 5\' 11"  (1.803 m)   Wt 99.8 kg   SpO2 100%   BMI 30.68 kg/m   - Neuro: alert NAD  - Cardiovascular: Sinus  Drips: none.   CVP:  [7 mmHg-14 mmHg] 14 mmHg  - Pulm: EWOB.  SS CT output  ABG    Component Value Date/Time   PHART 7.364 07/06/2020 1905   PCO2ART 39.1 07/06/2020 1905   PO2ART 81 (L) 07/06/2020 1905   HCO3 22.0 07/06/2020 1905   TCO2 23 07/06/2020 1905   ACIDBASEDEF 3.0 (H) 07/06/2020 1905   O2SAT 95.0 07/06/2020 1905    - Abd: soft - Extremity: warm  .Intake/Output      02/16 0701 02/17 0700 02/17 0701 02/18 0700   I.V. (mL/kg) 2927.7 (29.3)    Blood 550    IV Piggyback 1764.1    Total Intake(mL/kg) 5241.8 (52.5)    Urine (mL/kg/hr) 3290 (1.4)    Blood 868    Chest Tube 520    Total Output 4678    Net +563.8            _______________________________________________________________ Labs: CBC Latest Ref Rng & Units 07/07/2020 07/06/2020 07/06/2020  WBC 4.0 - 10.5 K/uL 8.3 8.8 -  Hemoglobin 13.0 - 17.0 g/dL 10.4(L) 10.8(L) 9.9(L)  Hematocrit 39.0 - 52.0 % 29.6(L) 30.0(L) 29.0(L)  Platelets 150 - 400 K/uL 135(L) 133(L) -   CMP Latest Ref Rng & Units 07/07/2020 07/06/2020 07/06/2020  Glucose 70 - 99 mg/dL 07/08/2020) 956(O) -  BUN 6 - 20 mg/dL 10 11 -  Creatinine 130(Q - 1.24 mg/dL 6.57 8.46 -  Sodium 9.62 - 145 mmol/L 136 133(L) 141  Potassium 3.5 - 5.1 mmol/L 4.0 4.1 4.0  Chloride 98 - 111 mmol/L 106 105 -   CO2 22 - 32 mmol/L 24 21(L) -  Calcium 8.9 - 10.3 mg/dL 8.0(L) 7.6(L) -  Total Protein 6.5 - 8.1 g/dL - - -  Total Bilirubin 0.3 - 1.2 mg/dL - - -  Alkaline Phos 38 - 126 U/L - - -  AST 15 - 41 U/L - - -  ALT 0 - 44 U/L - - -    CXR: Small left effusion PV congestion  _______________________________________________________________  Assessment and Plan: POD 1 s/p CABG 2, aortic valve resuspension  Neuro: adding meds for pain control CV: sinus.  On a/s/bb.  Will remove a line Pulm: continue pulm toilet.  Will bulb CT Renal: good uop.  Creat stable GI: advancing diet Heme: stable ID: low grade fever Endo: SSI Dispo: likely floor today   Dalinda Heidt O Stpehanie Montroy 07/07/2020 7:30 AM

## 2020-07-07 NOTE — Discharge Summary (Signed)
Physician Discharge Summary  Patient ID: Kevin Combs MRN: [id number] DOB/AGE: 1976/01/05 45 y.o.  Admit date: 07/06/2020 Discharge date: 07/10/2020  Referring:Acharya, Gennie Alma, MD Primary Care:Patient, No Pcp Per Primary Cardiologist:Gayatri Wynell Balloon, MD Admission Diagnoses:  Discharge Diagnoses:  Active Problems:   S/P CABG x 2   Patient Active Problem List   Diagnosis Date Noted  . S/P CABG x 2 07/06/2020  . HTN (hypertension) 05/27/2020  . Pericarditis 05/10/2020  . Anomalous left coronary artery   . Chest pain   . Acute appendicitis 01/28/2015  . Acute appendicitis with localized peritonitis 01/28/2015   History of the present illness: The patient is a 45 year old male who originally presented to the hospital with new onset chest pain.  He underwent evaluation in the emergency department where he was noted to have diffuse ST elevations.  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  He is undergone full cardiology evaluation including stress test which was negative and he was followed up in the clinic.  He was referred in cardiothoracic surgical consultation to Dr. Cliffton Asters.  The patient and all relevant studies were evaluated and he was felt to be a candidate for surgical repair and was admitted electively for the procedure.  Hospital course the patient was taken the operating room on 07/06/2020 at which time he underwent aortic root exploration with partial unroofing of the left coronary artery.  Due to the intraoperative findings it was determined that the best course of action would be to proceed with CABG x2 with grafts placed 1.  LIMA to LAD and #2.  Reverse saphenous vein graft to the obtuse marginal #1.  He tolerated this procedure well and was taken to the surgical intensive care unit in stable condition.  Postoperative hospital course:  The patient was extubated using standard post cardiac surgical  protocols.  He has remained neurologically intact.  He is in a normal sinus rhythm and has been started on aspirin statin and beta-blocker.  All routine lines, monitors and drainage devices were removed in the usual manner.  He does have an expected acute blood loss anemia with postop day 1 hemoglobin hematocrit 10.4/29.6 respectively.  He has good urine output and renal function is within normal limits.  Blood sugars have been under adequate control using standard protocols.  Oxygen has been weaned and he maintains good saturations on room air.  He is tolerating routine cardiac rehab modalities and walking well in the hallways.  Incisions are healing well without evidence of infection.  He has maintained normal sinus rhythm without significant ectopy or dysrhythmias while on 4 E.  He is tolerating diet.  He is having bowel movements has good urine output.  At the time of discharge he is felt to be quite stable.   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 Post-op Dx: Same Procedure: Aortic root exploration Partial unroofing of the left coronary artery Resuspension of the left to right commissure Coronary artery bypass grafting x2 with LIMA to LAD, and reverse saphenous vein graft to OM1.   Surgeon and Role:      * Lightfoot, Eliezer Lofts, MD - Primary    Webb Laws, PA-C- assisting  Anesthesia  general EBL: 800 ml Blood Administration: None Xclamp Time: 2 hours and 10 minutes  Pump Time: 151 min  Discharge Exam: Blood pressure (!) 127/96, pulse 100, temperature 99.3 F (37.4 C), temperature source Oral, resp. rate 20, height 5\' 11"  (1.803 m), weight 96.8 kg, SpO2 98 %.   General appearance: alert, cooperative and no distress Heart: regular rate and rhythm Lungs: clear to auscultation bilaterally Abdomen:  benign Extremities: no edema Wound: incis healing well   Disposition: Discharge disposition: 01-Home or Self Care       Discharge Instructions    Amb Referral to Cardiac Rehabilitation   Complete by: As directed    Diagnosis: CABG   CABG X ___: 2   After initial evaluation and assessments completed: Virtual Based Care may be provided alone or in conjunction with Phase 2 Cardiac Rehab based on patient barriers.: Yes   Discharge patient   Complete by: As directed    Discharge disposition: 01-Home or Self Care   Discharge patient date: 07/10/2020     Allergies as of 07/10/2020   No Known Allergies     Medication List    STOP taking these medications   amLODipine 10 MG tablet Commonly known as: NORVASC   carvedilol 12.5 MG tablet Commonly known as: COREG   colchicine 0.6 MG tablet   cyclobenzaprine 5 MG tablet Commonly known as: FLEXERIL   hydrOXYzine 25 MG tablet Commonly known as: ATARAX/VISTARIL   ibuprofen 800 MG tablet Commonly known as: ADVIL     TAKE these medications   acetaminophen 500 MG tablet Commonly known as: TYLENOL Take 1,000 mg by mouth every 6 (six) hours as needed (for pain).   aspirin 325 MG EC tablet Take 1 tablet (325 mg total) by mouth daily.   atorvastatin 20 MG tablet Commonly known as: LIPITOR Take 4 tablets (80 mg total) by mouth daily.   metoprolol tartrate 25 MG tablet Commonly known as: LOPRESSOR Take 0.5 tablets (12.5 mg total) by mouth 2 (two) times daily.   traMADol 50 MG tablet Commonly known as: ULTRAM Take 1 tablet (50 mg total) by mouth every 6 (six) hours as needed for up to 7 days for severe pain.       Follow-up Information    Lightfoot, 07/12/2020, MD Follow up.   Specialty: Cardiothoracic Surgery Why: Please see discharge paperwork for follow-up appointment with surgeon. Contact information: [address] [place] [phone]        [phone], MD Follow up.    Specialties: Cardiology, Radiology Why: See discharge paperwork for follow-up appointment with cardiology. Contact information: [address] [place] [phone]              The patient has been discharged on:   1.Beta Blocker:  Yes [ y  ]                              No   [   ]                              If No, reason:  2.Ace Inhibitor/ARB: Yes [   ]                                     No  [  n  ]  If No, reason:BP well controlled, would potentially make too labile. Can be added outpatient if required  3.Statin:   Yes [ y  ]                  No  [   ]                  If No, reason:  4.Ecasa:  Yes  [ y  ]                  No   [   ]                  If No, reason:  Signed: Noel Christmas 07/10/2020, 12:04 PM

## 2020-07-07 NOTE — Discharge Instructions (Signed)
[facility] office  [phone]  Endoscopic Saphenous Vein Harvesting, Care After This sheet gives you information about how to care for yourself after your procedure. Your health care provider may also give you more specific instructions. If you have problems or questions, contact your health care provider. What can I expect after the procedure? After the procedure, it is common to have:  Pain.  Bruising.  Swelling.  Numbness. Follow these instructions at home: Incision care  Follow instructions from your health care provider about how to take care of your incisions. Make sure you: ? Wash your hands with soap and water before and after you change your bandages (dressings). If soap and water are not available, use hand sanitizer. ? Change your dressings as told by your health care provider. ? Leave stitches (sutures), skin glue, or adhesive strips in place. These skin closures may need to stay in place for 2 weeks or longer. If adhesive strip edges start to loosen and curl up, you may trim the loose edges. Do not remove adhesive strips completely unless your health care provider tells you to do that.  Check your incision areas every day for signs of infection. Check for: ? More redness, swelling, or pain. ? Fluid or blood. ? Warmth. ? Pus or a bad smell.   Medicines  Take over-the-counter and prescription medicines only as told by your health care provider.  Ask your health care provider if the medicine prescribed to you requires you to avoid driving or using heavy machinery. General instructions  Raise (elevate) your legs above the level of your heart while you are sitting or lying down.  Avoid crossing your legs.  Avoid sitting for long periods of time. Change positions every 30 minutes.  Do any exercises your health care providers have given you. These may include deep breathing, coughing, and walking exercises.  You may shower, clean the incisions gently with soap and water  and pat dry gently.  Wear compression stockings as told by your health care provider. These stockings help to prevent blood clots and reduce swelling in your legs.  Keep all follow-up visits as told by your health care provider. This is important. Contact a health care provider if:  Medicine does not help your pain.  Your pain gets worse.  You have new leg bruises or your leg bruises get bigger.  Your leg feels numb.  You have more redness, swelling, or pain around your incision.  You have fluid or blood coming from your incision.  Your incision feels warm to the touch.  You have pus or a bad smell coming from your incision.  You have a fever. Get help right away if:  Your pain is severe.  You develop pain, tenderness, warmth, redness, or swelling in any part of your leg.  You have chest pain.  You have trouble breathing. Summary  Raise (elevate) your legs above the level of your heart while you are sitting or lying down.  Wear compression stockings as told by your health care provider.  Make sure you know which symptoms should prompt you to contact your health care provider.  Keep all follow-up visits as told by your health care provider. This information is not intended to replace advice given to you by your health care provider. Make sure you discuss any questions you have with your health care provider. Document Revised: 04/14/2018 Document Reviewed: 04/14/2018 Elsevier Patient Education  2021 [employer]vier Inc. Coronary Artery Bypass Grafting, Care After This sheet gives you  information about how to care for yourself after your procedure. Your doctor may also give you more specific instructions. If you have problems or questions, call your doctor. What can I expect after the procedure? After the procedure, it is common to:  Feel sick to your stomach (nauseous).  Not want to eat as much as normal (lack of appetite).  Have trouble pooping (constipation).  Have  weakness and tiredness (fatigue).  Feel sad (depressed) or grouchy (irritable).  Have pain or discomfort around the cuts from surgery (incisions). Follow these instructions at home: Medicines  Take over-the-counter and prescription medicines only as told by your doctor. Do not stop taking medicines or start any new medicines unless your doctor says it is okay.  If you were prescribed an antibiotic medicine, take it as told by your doctor. Do not stop taking the antibiotic even if you start to feel better. Incision care  Follow instructions from your doctor about how to take care of your cuts from surgery. Make sure you: ? Wash your hands with soap and water before and after you change your bandage (dressing). If you cannot use soap and water, use hand sanitizer. ? Change your bandage as told by your doctor. ? Leave stitches (sutures), skin glue, or skin tape (adhesive) strips in place. They may need to stay in place for 2 weeks or longer. If tape strips get loose and curl up, you may trim the loose edges. Do not remove tape strips completely unless your doctor says it is okay.  Make sure the surgery cuts are clean, dry, and protected.  Check your cut areas every day for signs of infection. Check for: ? More redness, swelling, or pain. ? More fluid or blood. ? Warmth. ? Pus or a bad smell.  If cuts were made in your legs: ? Avoid crossing your legs. ? Avoid sitting for long periods of time. Change positions every 30 minutes. ? Raise (elevate) your legs when you are sitting.   Bathing  You may shower . Pat the surgery cuts dry. Do not rub the cuts to dry.  Ask your doctor when you can shower. Eating and drinking  Eat foods that are high in fiber, such as beans, nuts, whole grains, and raw fruits and vegetables. Any meats you eat should be lean cut. Avoid canned, processed, and fried foods. This can help prevent trouble pooping. This is also a part of a heart-healthy diet.  Drink  enough fluid to keep your pee (urine) pale yellow.  Do not drink alcohol until you are fully recovered. Ask your doctor when it is safe to drink alcohol.   Activity  Rest and limit your activity as told by your doctor. You may be told to: ? Stop any activity right away if you have chest pain, shortness of breath, irregular heartbeats, or dizziness. Get help right away if you have any of these symptoms. ? Move around often for short periods or take short walks as told by your doctor. Slowly increase your activities. ? Avoid lifting, pushing, or pulling anything that is heavier than 10 lb (4.5 kg) for at least 6 weeks or as told by your doctor.  Do physical therapy or a cardiac rehab (cardiac rehabilitation) program as told by your doctor. ? Physical therapy involves doing exercises to maintain movement and build strength and endurance. ? A cardiac rehab program includes:  Exercise training.  Education.  Counseling.  Do not drive until your doctor says it is okay.  Ask your doctor when you can go back to work.  Ask your doctor when you can be sexually active. General instructions  Do not drive or use heavy machinery while taking prescription pain medicine.  Do not use any products that contain nicotine or tobacco. These include cigarettes, e-cigarettes, and chewing tobacco. If you need help quitting, ask your doctor.  Take 2-3 deep breaths every few hours during the day while you get better. This helps expand your lungs and prevent problems.  If you were given a device called an incentive spirometer, use it several times a day to practice deep breathing. Support your chest with a pillow or your arms when you take deep breaths or cough.  Wear compression stockings as told by your doctor.  Weigh yourself every day. This helps to see if your body is holding (retaining) fluid that may make your heart and lungs work harder.  Keep all follow-up visits as told by your doctor. This is  important. Contact a doctor if:  You have more redness, swelling, or pain around any cut.  You have more fluid or blood coming from any cut.  Any cut feels warm to the touch.  You have pus or a bad smell coming from any cut.  You have a fever.  You have swelling in your ankles or legs.  You have pain in your legs.  You gain 2 lb (0.9 kg) or more a day.  You feel sick to your stomach or you throw up (vomit).  You have watery poop (diarrhea). Get help right away if:  You have chest pain that goes to your jaw or arms.  You are short of breath.  You have a fast or irregular heartbeat.  You notice a "clicking" in your breastbone (sternum) when you move.  You have any signs of a stroke. "BE FAST" is an easy way to remember the main warning signs: ? B - Balance. Signs are dizziness, sudden trouble walking, or loss of balance. ? E - Eyes. Signs are trouble seeing or a change in how you see. ? F - Face. Signs are sudden weakness or loss of feeling of the face, or the face or eyelid drooping on one side. ? A - Arms. Signs are weakness or loss of feeling in an arm. This happens suddenly and usually on one side of the body. ? S - Speech. Signs are sudden trouble speaking, slurred speech, or trouble understanding what people say. ? T - Time. Time to call emergency services. Write down what time symptoms started.  You have other signs of a stroke, such as: ? A sudden, very bad headache with no known cause. ? Feeling sick to your stomach. ? Throwing up. ? Jerky movements you cannot control (seizure). These symptoms may be an emergency. Do not wait to see if the symptoms will go away. Get medical help right away. Call your local emergency services (911 in the [place] Do not drive yourself to the hospital. Summary  After the procedure, it is common to have pain or discomfort in the cuts from surgery (incisions).  Do not take baths, swim, or use a hot tub until your doctor says it is  okay.  Slowly increase your activities. You may need physical therapy or cardiac rehab.  Weigh yourself every day. This helps to see if your body is holding fluid. This information is not intended to replace advice given to you by your health care provider. Make sure you discuss any questions  you have with your health care provider. Document Revised: 01/14/2018 Document Reviewed: 01/14/2018 Elsevier Patient Education  2021 [employer]

## 2020-07-08 ENCOUNTER — Inpatient Hospital Stay (HOSPITAL_COMMUNITY): Payer: 59

## 2020-07-08 LAB — BASIC METABOLIC PANEL
Anion gap: 7 (ref 5–15)
BUN: 13 mg/dL (ref 6–20)
CO2: 27 mmol/L (ref 22–32)
Calcium: 8.1 mg/dL — ABNORMAL LOW (ref 8.9–10.3)
Chloride: 101 mmol/L (ref 98–111)
Creatinine, Ser: 1.07 mg/dL (ref 0.61–1.24)
GFR, Estimated: >60 mL/min (ref >60)
Glucose, Bld: 120 mg/dL — ABNORMAL HIGH (ref 70–99)
Potassium: 3.6 mmol/L (ref 3.5–5.1)
Sodium: 135 mmol/L (ref 135–145)

## 2020-07-08 LAB — GLUCOSE, CAPILLARY
Glucose-Capillary: 116 mg/dL — ABNORMAL HIGH (ref 70–99)
Glucose-Capillary: 126 mg/dL — ABNORMAL HIGH (ref 70–99)

## 2020-07-08 LAB — CBC
HCT: 29.6 % — ABNORMAL LOW (ref 39.0–52.0)
Hemoglobin: 10.1 g/dL — ABNORMAL LOW (ref 13.0–17.0)
MCH: 29.5 pg (ref 26.0–34.0)
MCHC: 34.1 g/dL (ref 30.0–36.0)
MCV: 86.5 fL (ref 80.0–100.0)
Platelets: 115 10*3/uL — ABNORMAL LOW (ref 150–400)
RBC: 3.42 MIL/uL — ABNORMAL LOW (ref 4.22–5.81)
RDW: 13.3 % (ref 11.5–15.5)
WBC: 8.2 10*3/uL (ref 4.0–10.5)
nRBC: 0 % (ref 0.0–0.2)

## 2020-07-08 MED ORDER — FUROSEMIDE 40 MG PO TABS
40.0000 mg | ORAL_TABLET | Freq: Once | ORAL | Status: AC
  Administered 2020-07-08: 40 mg via ORAL
  Filled 2020-07-08: qty 1

## 2020-07-08 MED ORDER — POTASSIUM CHLORIDE CRYS ER 20 MEQ PO TBCR
30.0000 meq | EXTENDED_RELEASE_TABLET | Freq: Two times a day (BID) | ORAL | Status: AC
  Administered 2020-07-08 (×2): 30 meq via ORAL
  Filled 2020-07-08 (×2): qty 1

## 2020-07-08 MED ORDER — ATORVASTATIN CALCIUM 80 MG PO TABS
80.0000 mg | ORAL_TABLET | Freq: Every day | ORAL | Status: DC
  Administered 2020-07-08 – 2020-07-09 (×2): 80 mg via ORAL
  Filled 2020-07-08 (×3): qty 1

## 2020-07-08 NOTE — Progress Notes (Addendum)
      [address]       [place] [address]             [phone]      2 Days Post-Op Procedure(s) (LRB): CORONARY ARTERY BYPASS GRAFTING (CABG) TIMES TWO USING LEFT INTERNAL MAMMARY ARTERY AND RIGHT GREATER SAPHENOUS VEIN HARVESTED ENDOSCOPICALLY, CORONARY UNROOFING WITH RESUSPENSION OF THE AORTIC VALVE (N/A) TRANSESOPHAGEAL ECHOCARDIOGRAM (TEE) (N/A) Subjective: Awake and alert, still having generalized pain.  Has lidocaine patches applied to right and left chest and is on scheduled Toradol along with usual PRN analgesics.   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 Lungs: Breath sounds are clear.  Abdomen: soft and non-tender Extremities: minimal edema, RLE EVH incision is intact and dry. Wound: the sternotomuy incicion is covered with an Aquacel dressing. The JP drainage has tapered off.   Lab Results: Recent Labs    07/07/20 1554 07/08/20 0059  WBC 9.5 8.2  HGB 10.4* 10.1*  HCT 30.8* 29.6*  PLT 127* 115*   BMET:  Recent Labs    07/07/20 1554 07/08/20 0059  NA 136 135  K 3.9 3.6  CL 103 101  CO2 26 27  GLUCOSE 114* 120*  BUN 14 13  CREATININE 1.12 1.07  CALCIUM 8.1* 8.1*    PT/INR:  Recent Labs    07/06/20 1412  LABPROT 18.4*  INR 1.6*   ABG    Component Value Date/Time   PHART  7.364 07/06/2020 1905   HCO3 22.0 07/06/2020 1905   TCO2 23 07/06/2020 1905   ACIDBASEDEF 3.0 (H) 07/06/2020 1905   O2SAT 95.0 07/06/2020 1905   CBG (last 3)  Recent Labs    07/07/20 2345 07/08/20 0407 07/08/20 0755  GLUCAP 106* 116* 126*    Assessment/Plan: S/P Procedure(s) (LRB): CORONARY ARTERY BYPASS GRAFTING (CABG) TIMES TWO USING LEFT INTERNAL MAMMARY ARTERY AND RIGHT GREATER SAPHENOUS VEIN HARVESTED ENDOSCOPICALLY, CORONARY UNROOFING WITH RESUSPENSION OF THE AORTIC VALVE (N/A) TRANSESOPHAGEAL ECHOCARDIOGRAM (TEE) (N/A)  -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 BP and cardiac rhythm stable. On ASA, low-dose metoprolol, and atorvastatin. Maintaining O2 sats on RA.   D/C the JP's today. Advance activity.   -Mild acute blood loss anemia- monitor  -ENDO- no h/o diabetes and has not required any SSI coverage. Will discontinue monitoring and SSI.   -DVT PPX- on daily SQ enoxaparin.   -K+ 3.9->3.6- will supplement today.      LOS: 2 days    Kevin Combs , [place] [phone] 07/08/2020   Agree with above. Overall doing well, but has some nausea likely related to the medications. Drains removed today. The next day or so.  [name]

## 2020-07-08 NOTE — Progress Notes (Signed)
CARDIAC REHAB PHASE I   PRE:  Rate/Rhythm: 90 SR  BP:  Sitting: 129/86     SaO2: 95 RA  MODE:  Ambulation: 470 ft   POST:  Rate/Rhythm: 102  BP:  Sitting: 141/91   SaO2: 90 RA  Pt tolerated exercise well, slightly SOB, checked SaO2 while walking, lowest reading was 90 on RA. Amb 470 ft. Pt practiced pursed lip breathing and felt better, minimal pain in leg from incisions. Pt returned to bed with call bell in reach. Education given to pt and wife in room about sternal precautions, IS usage, CPR phase 2 MC location, heart health diet, home exercise guidelines, and restrictions. Pt verbalized understanding and had no questions.  Will put in referral for CPR phase 2 MC location.  [id number] Harrie Jeans ACSM-EP 07/08/2020 1:36 PM

## 2020-07-08 NOTE — Progress Notes (Signed)
Mobility Specialist: Progress Note   07/08/20 1711  Mobility  Activity Ambulated in hall  Level of Assistance Independent  Assistive Device None  Distance Ambulated (ft) 410 ft  Mobility Response Tolerated well  Mobility performed by Mobility specialist  $Mobility charge 1 Mobility   Pre-Mobility: 93 HR During Mobility: 103 HR Post-Mobility: 109 HR, 122/78 BP, 89% SpO2            After 1 minute: 96% SpO2  Pt had no c/o during ambulation. Pt desaturated to 89% after walk, recovered quickly. RN notified. Pt back to bed after walk.  [facility] Kevin Combs Mobility Specialist Mobility Specialist Phone: [phone]

## 2020-07-09 LAB — CBC
HCT: 28.7 % — ABNORMAL LOW (ref 39.0–52.0)
Hemoglobin: 9.8 g/dL — ABNORMAL LOW (ref 13.0–17.0)
MCH: 29.4 pg (ref 26.0–34.0)
MCHC: 34.1 g/dL (ref 30.0–36.0)
MCV: 86.2 fL (ref 80.0–100.0)
Platelets: 121 10*3/uL — ABNORMAL LOW (ref 150–400)
RBC: 3.33 MIL/uL — ABNORMAL LOW (ref 4.22–5.81)
RDW: 13.4 % (ref 11.5–15.5)
WBC: 7.4 10*3/uL (ref 4.0–10.5)
nRBC: 0 % (ref 0.0–0.2)

## 2020-07-09 LAB — BASIC METABOLIC PANEL
Anion gap: 10 (ref 5–15)
BUN: 12 mg/dL (ref 6–20)
CO2: 24 mmol/L (ref 22–32)
Calcium: 8.4 mg/dL — ABNORMAL LOW (ref 8.9–10.3)
Chloride: 101 mmol/L (ref 98–111)
Creatinine, Ser: 1.1 mg/dL (ref 0.61–1.24)
GFR, Estimated: >60 mL/min (ref >60)
Glucose, Bld: 107 mg/dL — ABNORMAL HIGH (ref 70–99)
Potassium: 3.8 mmol/L (ref 3.5–5.1)
Sodium: 135 mmol/L (ref 135–145)

## 2020-07-09 NOTE — Progress Notes (Signed)
CARDIAC REHAB PHASE I   PRE:  Rate/Rhythm: 99 NSR    BP: sitting 140/109    SaO2: 94 RA  MODE:  Ambulation: 470 ft   POST:  Rate/Rhythm: 113 ST    BP: sitting 140/92     SaO2: 96 RA  1045-1100 Patient resting in bed upon arrival. Denied complaints. Out of bed independently utilizing heart pillow for sternal protection. Ambulated in hallway independently with steady gait noted. Denied complaints during ambulation. Post ambulation patient positioned self in chair with call bell and phone in reach. Denied need for additional education reinforcement. Anticipate D/C tomorrow per MD note. Patient encouraged to ambulate in hallway independently x2 again today. Mobility team and primary RN aware of recommendations.   Shimika Ames Hoover Brunette RN, BSN

## 2020-07-09 NOTE — Progress Notes (Addendum)
[address]       [place] [address]             [phone]      3 Days Post-Op Procedure(s) (LRB): CORONARY ARTERY BYPASS GRAFTING (CABG) TIMES TWO USING LEFT INTERNAL MAMMARY ARTERY AND RIGHT GREATER SAPHENOUS VEIN HARVESTED ENDOSCOPICALLY, CORONARY UNROOFING WITH RESUSPENSION OF THE AORTIC VALVE (N/A) TRANSESOPHAGEAL ECHOCARDIOGRAM (TEE) (N/A) Subjective:  looks and feels pretty well, minor discomforts  Objective: Vital signs in last 24 hours: Temp:  [99.1 F (37.3 C)-100 F (37.8 C)] 99.5 F (37.5 C) (02/19 0355) Pulse Rate:  [78-98] 78 (02/19 0355) Cardiac Rhythm: Normal sinus rhythm (02/19 0753) Resp:  [18-20] 20 (02/19 0355) BP: (99-124)/(66-81) 99/66 (02/19 0355) SpO2:  [94 %-100 %] 97 % (02/19 0355) Weight:  [99.4 kg] 99.4 kg (02/19 0355)  Hemodynamic parameters for last 24 hours:    Intake/Output from previous day: 02/18 0701 - 02/19 0700 In: -  Out: 295 [Urine:210; Chest Tube:85] Intake/Output this shift: No intake/output data recorded.  General appearance: alert, cooperative and no distress Heart: regular rate and rhythm Lungs: mildly dim in bases Abdomen: benign Extremities: no edema Wound: EVH site looks good, chest dressing with minor drainage  Lab Results: Recent Labs    07/08/20 0059 07/09/20 0232  WBC 8.2 7.4  HGB 10.1* 9.8*  HCT 29.6* 28.7*  PLT 115* 121*   BMET:  Recent Labs    07/08/20 0059 07/09/20 0232  NA 135 135  K 3.6 3.8  CL 101 101  CO2 27 24  GLUCOSE 120* 107*  BUN 13 12  CREATININE 1.07 1.10  CALCIUM 8.1* 8.4*    PT/INR:  Recent Labs    07/06/20 1412  LABPROT 18.4*  INR 1.6*   ABG    Component Value Date/Time   PHART 7.364 07/06/2020 1905   HCO3 22.0 07/06/2020 1905   TCO2 23 07/06/2020 1905   ACIDBASEDEF 3.0 (H) 07/06/2020 1905   O2SAT 95.0 07/06/2020 1905   CBG (last 3)  Recent Labs    07/07/20 2345 07/08/20 0407 07/08/20 0755  GLUCAP 106* 116* 126*    Meds Scheduled  Meds: . acetaminophen  1,000 mg Oral Q6H  . aspirin EC  325 mg Oral Daily  . atorvastatin  80 mg Oral Daily  . bisacodyl  10 mg Oral Daily   Or  . bisacodyl  10 mg Rectal Daily  . docusate sodium  200 mg Oral Daily  . enoxaparin (LOVENOX) injection  40 mg Subcutaneous QHS  . ketorolac  30 mg Intravenous Q6H  . lidocaine  2 patch Transdermal Q24H  . mouth rinse  15 mL Mouth Rinse BID  . methocarbamol  500 mg Oral TID  . metoprolol tartrate  12.5 mg Oral BID  . pantoprazole  40 mg Oral Daily   Continuous Infusions: PRN Meds:.morphine injection, ondansetron (ZOFRAN) IV, oxyCODONE, traMADol  Xrays DG Chest Port 1 View  Result Date: 07/08/2020 CLINICAL DATA:  Pleural effusion, CABG EXAM: PORTABLE CHEST 1 VIEW COMPARISON:  07/07/2020 FINDINGS: Interval removal of right internal jugular central line. Left basilar chest tube remains in place. No pneumothorax. Bibasilar atelectasis, left greater than right. Heart is borderline in size. Changes of CABG. IMPRESSION: Left basilar chest tube remains in place without pneumothorax. Bibasilar atelectasis, left greater than right, stable Electronically Signed   By: Charlett Nose M.D.   On: 07/08/2020 07:33    Assessment/Plan: S/P Procedure(s) (LRB): CORONARY ARTERY BYPASS GRAFTING (CABG) TIMES TWO  USING LEFT INTERNAL MAMMARY ARTERY AND RIGHT GREATER SAPHENOUS VEIN HARVESTED ENDOSCOPICALLY, CORONARY UNROOFING WITH RESUSPENSION OF THE AORTIC VALVE (N/A) TRANSESOPHAGEAL ECHOCARDIOGRAM (TEE) (N/A)  1 Tmax 100, sBP 90-120's. Sinus rhythm 2 sats good on RA 3 normal renal fxn, weight about preop, not on diuretics 4 expected ABL anemia stable  5 no leukocytosis 6 thrombocytopenia conts to trend improved 7 BS well controlled- not a diabetic 8 routine pulm toilet.rehab 9 prob home in am if no new issues      LOS: 3 days    Rowe Clack PA-C Pager [phone] 07/09/2020  Plan home tomorrow  I have seen and examined Kevin Combs and agree with  the above assessment  and plan.  Delight Ovens MD Beeper [phone] Office [phone] 07/09/2020 10:54 AM

## 2020-07-10 MED ORDER — ASPIRIN 325 MG PO TBEC: 325.0000 mg | DELAYED_RELEASE_TABLET | Freq: Every day | ORAL | Status: DC

## 2020-07-10 MED ORDER — ATORVASTATIN CALCIUM 20 MG PO TABS: 80.0000 mg | ORAL_TABLET | Freq: Every day | ORAL | 1 refills | Status: DC

## 2020-07-10 MED ORDER — TRAMADOL HCL 50 MG PO TABS: 50.0000 mg | ORAL_TABLET | Freq: Four times a day (QID) | ORAL | 0 refills | Status: DC | PRN

## 2020-07-10 MED ORDER — METOPROLOL TARTRATE 25 MG PO TABS: 12.5000 mg | ORAL_TABLET | Freq: Two times a day (BID) | ORAL | 1 refills | Status: DC

## 2020-07-10 NOTE — Progress Notes (Signed)
      [address]       [place] [address]             [phone]      4 Days Post-Op Procedure(s) (LRB): CORONARY ARTERY BYPASS GRAFTING (CABG) TIMES TWO USING LEFT INTERNAL MAMMARY ARTERY AND RIGHT GREATER SAPHENOUS VEIN HARVESTED ENDOSCOPICALLY, CORONARY UNROOFING WITH RESUSPENSION OF THE AORTIC VALVE (N/A) TRANSESOPHAGEAL ECHOCARDIOGRAM (TEE) (N/A) Subjective:  feels well, tylenol controlling pain  Objective: Vital signs in last 24 hours: Temp:  [97.6 F (36.4 C)-99.7 F (37.6 C)] 98.8 F (37.1 C) (02/20 0407) Pulse Rate:  [76-98] 98 (02/20 0407) Cardiac Rhythm: Normal sinus rhythm (02/19 2140) Resp:  [19-20] 20 (02/20 0407) BP: (123-141)/(85-91) 123/88 (02/20 0407) SpO2:  [99 %-100 %] 100 % (02/20 0407) Weight:  [96.8 kg] 96.8 kg (02/20 0526)  Hemodynamic parameters for last 24 hours:    Intake/Output from previous day: 02/19 0701 - 02/20 0700 In: 460 [P.O.:460] Out: 1180 [Urine:1180] Intake/Output this shift: No intake/output data recorded.  General appearance: alert, cooperative and no distress Heart: regular rate and rhythm Lungs: clear to auscultation bilaterally Abdomen: benign Extremities: no edema Wound: incis healing well  Lab Results: Recent Labs    07/08/20 0059 07/09/20 0232  WBC 8.2 7.4  HGB 10.1* 9.8*  HCT 29.6* 28.7*  PLT 115* 121*   BMET:  Recent Labs    07/08/20 0059 07/09/20 0232  NA 135 135  K 3.6 3.8  CL 101 101  CO2 27 24  GLUCOSE 120* 107*  BUN 13 12  CREATININE 1.07 1.10  CALCIUM 8.1* 8.4*    PT/INR: No results for input(s): LABPROT, INR in the last 72 hours. ABG    Component Value Date/Time   PHART 7.364 07/06/2020 1905   HCO3 22.0 07/06/2020 1905   TCO2 23 07/06/2020 1905   ACIDBASEDEF 3.0 (H) 07/06/2020 1905   O2SAT 95.0 07/06/2020 1905   CBG (last 3)  Recent Labs    07/07/20 2345 07/08/20 0407 07/08/20 0755  GLUCAP 106* 116* 126*    Meds Scheduled Meds: . acetaminophen  1,000 mg  Oral Q6H  . aspirin EC  325 mg Oral Daily  . atorvastatin  80 mg Oral Daily  . bisacodyl  10 mg Oral Daily   Or  . bisacodyl  10 mg Rectal Daily  . docusate sodium  200 mg Oral Daily  . enoxaparin (LOVENOX) injection  40 mg Subcutaneous QHS  . lidocaine  2 patch Transdermal Q24H  . mouth rinse  15 mL Mouth Rinse BID  . methocarbamol  500 mg Oral TID  . metoprolol tartrate  12.5 mg Oral BID  . pantoprazole  40 mg Oral Daily   Continuous Infusions: PRN Meds:.morphine injection, ondansetron (ZOFRAN) IV, oxyCODONE, traMADol  Xrays No results found.  Assessment/Plan: S/P Procedure(s) (LRB): CORONARY ARTERY BYPASS GRAFTING (CABG) TIMES TWO USING LEFT INTERNAL MAMMARY ARTERY AND RIGHT GREATER SAPHENOUS VEIN HARVESTED ENDOSCOPICALLY, CORONARY UNROOFING WITH RESUSPENSION OF THE AORTIC VALVE (N/A) TRANSESOPHAGEAL ECHOCARDIOGRAM (TEE) (N/A)  1 Tmax 99.7, VSS, BP fairly well controlled, ACE-I/ARB not prudent at this time. Sinus rhythm 2 sats good on RA 3 no new labs 4 good UOP- weight below preop 5 will reduce lipitor to 20 daily as this is bot obstructive CAD and has good lipid profile. (D/W Dr Tyrone Sage) 6 stable for d/c    LOS: 4 days    Rowe Clack PA-C Pager [phone] 07/10/2020

## 2020-07-12 ENCOUNTER — Other Ambulatory Visit: Payer: Self-pay | Admitting: Physician Assistant

## 2020-07-12 ENCOUNTER — Telehealth: Payer: Self-pay | Admitting: *Deleted

## 2020-07-12 DIAGNOSIS — R110 Nausea: Secondary | ICD-10-CM

## 2020-07-12 MED ORDER — PROMETHAZINE HCL 12.5 MG PO TABS: 12.5000 mg | ORAL_TABLET | Freq: Four times a day (QID) | ORAL | 0 refills | Status: DC | PRN

## 2020-07-12 NOTE — Progress Notes (Signed)
Mr. Kevin Combs called the office c/o nausea without vomiting or abdominal pain. Rx sent to his preferred pharmacy for Phenergan 12.5mg  po q6h prn nausea for up to 3 days. He is scheduled to see Dr. Cliffton Asters on 07/15/20 for post-op follow up.   Gaynelle Arabian, PA-C

## 2020-07-12 NOTE — Telephone Encounter (Signed)
Kevin Combs called with c/o nausea s/p CABG by Dr. Cliffton Asters 2/16. Pt states since coming home he has stayed nauseous. This is inhibiting him from eating. Pt states nothing in particular causes the nausea but that it comes and goes throughout the day. Pt denies abd pain. Pt states his surgical pain is well controlled at this time. Per Gaynelle Arabian, PA, prescription to be sent in for pt. Kevin Combs has a f/u appt with Dr. Cliffton Asters this Friday. No other questions or concerns at this time.

## 2020-07-13 ENCOUNTER — Telehealth (HOSPITAL_COMMUNITY): Payer: Self-pay

## 2020-07-13 NOTE — Telephone Encounter (Signed)
Pt insurance is active and benefits verified through [facility] Co-pay $25, DED $500/0 met, out of pocket $3,000/$103.12 met, co-insurance 0%. no pre-authorization required. Passport, 07/13/2020@9 :01am, REF# [id number]  Will contact patient to see if he is interested in the Cardiac Rehab Program. If interested, patient will need to complete follow up appt. Once completed, patient will be contacted for scheduling upon review by the RN Navigator.

## 2020-07-13 NOTE — Telephone Encounter (Signed)
Attempted to call patient in regards to Cardiac Rehab - LM on VM 

## 2020-07-15 ENCOUNTER — Encounter: Payer: Self-pay | Admitting: Thoracic Surgery (Cardiothoracic Vascular Surgery)

## 2020-07-15 ENCOUNTER — Ambulatory Visit (INDEPENDENT_AMBULATORY_CARE_PROVIDER_SITE_OTHER): Payer: Self-pay | Admitting: Thoracic Surgery (Cardiothoracic Vascular Surgery)

## 2020-07-15 ENCOUNTER — Other Ambulatory Visit: Payer: Self-pay

## 2020-07-15 VITALS — BP 107/75 | HR 109 | Temp 98.1°F | Resp 20 | Ht 71.0 in | Wt 207.5 lb

## 2020-07-15 DIAGNOSIS — Z951 Presence of aortocoronary bypass graft: Secondary | ICD-10-CM

## 2020-07-15 NOTE — Progress Notes (Signed)
      [address]       [place] [address]             [phone]        BRAYDEN BRODHEAD [facility] Medical Record #[id number] Date of Birth: Aug 14, 1975  Referring: Parke Poisson, MD Primary Care: Sharlene Dory, DO Primary Cardiologist:Gayatri Wynell Balloon, MD  Reason for visit:   follow-up  History of Present Illness:     Mr. Ericson comes in for his 1 week follow-up appointment. Overall he is doing well. He only complains of chest wall tenderness.  Physical Exam: BP 107/75 (BP Location: Left Arm, Patient Position: Sitting, Cuff Size: Normal)   Pulse (!) 109   Temp 98.1 F (36.7 C) (Skin)   Resp 20   Ht 5\' 11"  (1.803 m)   Wt 207 lb 8 oz (94.1 kg)   SpO2 94% Comment: RA  BMI 28.94 kg/m   Alert NAD Incision clean.  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 Overall doing well. We'll follow up in 1 month with a chest x-ray.   54 07/15/2020 5:47 PM

## 2020-07-19 ENCOUNTER — Other Ambulatory Visit: Payer: Self-pay

## 2020-07-19 MED ORDER — ATORVASTATIN CALCIUM 80 MG PO TABS: 80.0000 mg | ORAL_TABLET | Freq: Every day | ORAL | 0 refills | Status: DC

## 2020-07-25 ENCOUNTER — Encounter: Payer: Self-pay | Admitting: General Practice

## 2020-07-25 ENCOUNTER — Telehealth: Payer: Self-pay

## 2020-07-25 NOTE — Telephone Encounter (Signed)
Patient called back, spoke with scheduler- who switched appointment with Verdon Cummins tomorrow over to Dr.Acharya's schedule for 03/08. MD was notified.

## 2020-07-25 NOTE — Telephone Encounter (Signed)
Called patient per Dr.Acharya's request to see if patient wants to switch from [facility] schedule for tomorrow and come see her on March 10th- patient was advised to call back, gave call back number.

## 2020-07-25 NOTE — Progress Notes (Deleted)
Cardiology Clinic Note   Patient Name: Kevin Combs Date of Encounter: 07/25/2020  Primary Care Provider:  [name], DO Primary Cardiologist:  [name], MD  Patient Profile    Kevin Combs 45 year old male presents the clinic today for follow-up evaluation status post CABG x2 by Dr. Kipp Brood on 07/06/2020.  Past Medical History    Past Medical History:  Diagnosis Date  . 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 Anxiety   . HTN (hypertension)   . Pericarditis 05/09/2020   Past Surgical History:  Procedure Laterality Date  . CORONARY ARTERY BYPASS GRAFT N/A 07/06/2020   Procedure: CORONARY ARTERY BYPASS GRAFTING (CABG) TIMES TWO USING LEFT INTERNAL MAMMARY ARTERY AND RIGHT GREATER SAPHENOUS VEIN HARVESTED ENDOSCOPICALLY, CORONARY UNROOFING WITH RESUSPENSION OF THE AORTIC VALVE;  Surgeon: [name], MD;  Location: [place];  Service: Open Heart Surgery;  Laterality: N/A;  . KNEE SURGERY Right 1992  . LAPAROSCOPIC APPENDECTOMY N/A 01/28/2015   Procedure: APPENDECTOMY LAPAROSCOPIC;  Surgeon: [name], MD;  Location: [facility];  Service: General;  Laterality: N/A;  . TEE WITHOUT CARDIOVERSION N/A 07/06/2020   Procedure: TRANSESOPHAGEAL ECHOCARDIOGRAM (TEE);  Surgeon: [name], MD;  Location: [place];  Service: Open Heart Surgery;  Laterality: N/A;    Allergies  No Known Allergies  History of Present Illness    Mr. Kowalchuk has a PMH of anomalous left coronary artery, pericarditis, hypertension, acute appendicitis, coronary artery disease status post CABG x2 on 07/06/2020.  He was initially admitted to the hospital 12/21 with complaints of left arm pain.  His EKG showed diffuse ST elevations and hypertensive urgency.  His EKG was reviewed and not felt to be representative of STEMI.  His ESR and CRP were minimally elevated.  It was felt that he had pericarditis.  He was prescribed ibuprofen and colchicine.  Due to his elevated troponin of  53 and abnormalities on EKG a coronary CTA was ordered and showed no coronary calcification.  An anomalous left main coronary artery off the right coronary cusp with intra-arterial and possible intramural course was noted.  CT FFR showed no flow limitation.  An exercise stress treadmill test and nuclear stress test showed no ischemia and no perfusion abnormalities.  He was seen by cardiothoracic surgery.  They recommended unroofing of the left main coronary artery.  His blood pressure was well controlled on amlodipine and carvedilol.  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  On follow-up he reported no recurrent episodes of chest pain.  He reported compliance with his colchicine medication.  He presented to the operating room 07/06/2020 and underwent aortic root exploration with partial unroofing of left coronary artery.  Due to intraoperative findings CABG x2 with LIMA-LAD and SVG-OM1 was performed.  He was extubated without issue and remained neurologically intact.  He maintained normal sinus rhythm and was started on aspirin, statin, and beta-blocker.  He progressed well postoperatively and his incisions were healing well without evidence of infection.  He tolerated his diet well and was discharged in stable condition on 07/10/2020.  He presents the clinic today for follow-up evaluation states***  *** denies chest pain, shortness of breath, lower extremity edema, fatigue, palpitations, melena, hematuria, hemoptysis, diaphoresis, weakness, presyncope, syncope, orthopnea, and PND.    Home Medications    Prior to Admission medications   Medication Sig Start Date End Date Taking? Authorizing Provider  acetaminophen (TYLENOL) 500 MG tablet  Take 1,000 mg by mouth every 6 (six) hours as needed (for pain).    [provider]  aspirin EC 325 MG EC tablet Take 1 tablet (325 mg total) by mouth daily. 07/10/20   Gold, Wayne E, PA-C   atorvastatin (LIPITOR) 80 MG tablet Take 1 tablet (80 mg total) by mouth daily. 07/19/20   Gold, Patrick Jupiter E, PA-C  metoprolol tartrate (LOPRESSOR) 25 MG tablet Take 0.5 tablets (12.5 mg total) by mouth 2 (two) times daily. 07/10/20   Gold, Wilder Glade, PA-C  promethazine (PHENERGAN) 12.5 MG tablet Take 1 tablet (12.5 mg total) by mouth every 6 (six) hours as needed for up to 3 days for nausea or vomiting. 07/12/20 07/15/20  Antony Odea, PA-C    Family History    Family History  Problem Relation Age of Onset  . Hypertension Mother   . Crohn's disease Daughter    He indicated that his mother is alive. He indicated that his father is alive. He indicated that his sister is alive. He indicated that his brother is alive. He indicated that his maternal grandmother is deceased. He indicated that his maternal grandfather is deceased. He indicated that his paternal grandmother is deceased. He indicated that his paternal grandfather is deceased. He indicated that his daughter is alive. He indicated that his son is alive.  Social History    Social History   Socioeconomic History  . Marital status: Single    Spouse name: Not on file  . Number of children: Not on file  . Years of education: Not on file  . Highest education level: Not on file  Occupational History  . Not on file  Tobacco Use  . Smoking status: Former Smoker    Types: Cigars  . Smokeless tobacco: Never Used  Vaping Use  . Vaping Use: Never used  Substance and Sexual Activity  . Alcohol use: Yes    Alcohol/week: 14.0 standard drinks    Types: 14 Standard drinks or equivalent per week    Comment: vodka  . Drug use: Yes    Frequency: 14.0 times per week    Types: Marijuana  . Sexual activity: Not on file  Other Topics Concern  . Not on file  Social History Narrative  . Not on file   Social Determinants of Health   Financial Resource Strain: Not on file  Food Insecurity: Not on file  Transportation Needs: Not on file   Physical Activity: Not on file  Stress: Not on file  Social Connections: Not on file  Intimate Partner Violence: Not on file     Review of Systems    General:  No chills, fever, night sweats or weight changes.  Cardiovascular:  No chest pain, dyspnea on exertion, edema, orthopnea, palpitations, paroxysmal nocturnal dyspnea. Dermatological: No rash, lesions/masses Respiratory: No cough, dyspnea Urologic: No hematuria, dysuria Abdominal:   No nausea, vomiting, diarrhea, bright red blood per rectum, melena, or hematemesis Neurologic:  No visual changes, wkns, changes in mental status. All other systems reviewed and are otherwise negative except as noted above.  Physical Exam    VS:  There were no vitals taken for this visit. , BMI There is no height or weight on file to calculate BMI. GEN: Well nourished, well developed, in no acute distress. HEENT: normal. Neck: Supple, no JVD, carotid bruits, or masses. Cardiac: RRR, no murmurs, rubs, or gallops. No clubbing, cyanosis, edema.  Radials/DP/PT 2+ and equal bilaterally.  Respiratory:  Respirations regular and unlabored, clear  to auscultation bilaterally. GI: Soft, nontender, nondistended, BS + x 4. MS: no deformity or atrophy. Skin: warm and dry, no rash. Neuro:  Strength and sensation are intact. Psych: Normal affect.  Accessory Clinical Findings    Recent Labs: 07/04/2020: ALT 31 07/07/2020: Magnesium 2.7 07/09/2020: BUN 12; Creatinine, Ser 1.10; Hemoglobin 9.8; Platelets 121; Potassium 3.8; Sodium 135   Recent Lipid Panel    Component Value Date/Time   CHOL 191 05/10/2020 0300   TRIG 52 05/10/2020 0300   HDL 76 05/10/2020 0300   CHOLHDL 2.5 05/10/2020 0300   VLDL 10 05/10/2020 0300   LDLCALC 105 (H) 05/10/2020 0300    ECG personally reviewed by me today- *** - No acute changes  Echo 05/09/20 IMPRESSIONS    1. Left ventricular ejection fraction, by estimation, is 55 to 60%. The  left ventricle has normal function.  The left ventricle has no regional  wall motion abnormalities. There is mild left ventricular hypertrophy.  Left ventricular diastolic parameters  are consistent with Grade I diastolic dysfunction (impaired relaxation).  2. Right ventricular systolic function is normal. The right ventricular  size is normal. Tricuspid regurgitation signal is inadequate for assessing  PA pressure.  3. The mitral valve is abnormal. Trivial mitral valve regurgitation.  4. The aortic valve is tricuspid. Aortic valve regurgitation is not  visualized.  5. The inferior vena cava is normal in size with greater than 50%  respiratory variability, suggesting right atrial pressure of 3 mmHg.   Comparison(s): No prior Echocardiogram.   Conclusion(s)/Recommendation(s): Critical findings reported to Dr. Burt Knack  and acknowledged at 12:24 pm on 05/09/20.  Assessment & Plan   1.  Anomalous left coronary artery/coronary artery disease-no chest pain today.  Status post CABG x2 on 07/06/2020 LIMA-LAD, SVG-OM1.  Continues to increase physical activity slowly.  Reports compliance with sternal precautions. Continue aspirin, metoprolol, atorvastatin Heart healthy low-sodium diet-salty 6 given Increase physical activity as tolerated  Essential hypertension-BP today***.  Well-controlled at home. Continue metoprolol Heart healthy low-sodium diet-salty 6 given Increase physical activity as tolerated  Acute idiopathic pericarditis-no chest pain today.  Completed course of therapy. Heart healthy low-sodium diet-salty 6 given Increase physical activity as tolerated  Disposition: Follow-up with Dr. Margaretann Loveless in 3 months and Dr. Kipp Brood as scheduled.   Jossie Ng. Aiyannah Fayad NP-C    07/25/2020, 6:47 AM [address] Office [phone] Fax [phone]  Notice: This dictation was prepared with Dragon dictation along with smaller phrase technology. Any transcriptional errors  that result from this process are unintentional and may not be corrected upon review.  I spent***minutes examining this patient, reviewing medications, and using patient centered shared decision making involving her cardiac care.  Prior to her visit I spent greater than 20 minutes reviewing her past medical history,  medications, and prior cardiac tests.

## 2020-07-26 ENCOUNTER — Ambulatory Visit (INDEPENDENT_AMBULATORY_CARE_PROVIDER_SITE_OTHER): Payer: 59 | Admitting: Internal Medicine

## 2020-07-26 ENCOUNTER — Ambulatory Visit: Payer: 59 | Admitting: General Practice

## 2020-07-26 ENCOUNTER — Other Ambulatory Visit: Payer: Self-pay

## 2020-07-26 ENCOUNTER — Encounter: Payer: Self-pay | Admitting: Internal Medicine

## 2020-07-26 VITALS — BP 102/70 | HR 66 | Ht 71.0 in | Wt 215.0 lb

## 2020-07-26 DIAGNOSIS — I10 Essential (primary) hypertension: Secondary | ICD-10-CM

## 2020-07-26 DIAGNOSIS — Q245 Malformation of coronary vessels: Secondary | ICD-10-CM | POA: Diagnosis not present

## 2020-07-26 DIAGNOSIS — Z951 Presence of aortocoronary bypass graft: Secondary | ICD-10-CM

## 2020-07-26 DIAGNOSIS — I300 Acute nonspecific idiopathic pericarditis: Secondary | ICD-10-CM | POA: Diagnosis not present

## 2020-07-26 NOTE — Patient Instructions (Signed)
Medication Instructions:  YOU MAY DECREASE YOUR ASPIRIN DOSE TO 81MG  AFTER FOLLOW UP APPOINTMENT WITH Dr. ON 3/25- PLEASE REVIEW THIS WITH DR. 4/25 BEFORE DECREASING DOSE.  *If you need a refill on your cardiac medications before your next appointment, please call your pharmacy*  Follow-Up: At [facility], you and your health needs are our priority.  As part of our continuing mission to provide you with exceptional heart care, we have created designated Provider Care Teams.  These Care Teams include your primary Cardiologist (physician) and Advanced Practice Providers (APPs -  Physician Assistants and Nurse Practitioners) who all work together to provide you with the care you need, when you need it.  We recommend signing up for the patient portal called "MyChart".  Sign up information is provided on this After Visit Summary.  MyChart is used to connect with patients for Virtual Visits (Telemedicine).  Patients are able to view lab/test results, encounter notes, upcoming appointments, etc.  Non-urgent messages can be sent to your provider as well.   To learn more about what you can do with MyChart, go to [facility].    Your next appointment:   MAY 10th at 10:20am   The format for your next appointment:   In Person  Provider:   May 12, MD

## 2020-07-26 NOTE — Progress Notes (Signed)
Cardiology Office Note:    Date:  07/26/2020   ID:  RAHMIR BEEVER, DOB Oct 07, 1975, MRN [id number]  PCP:  [name], DO  Cardiologist:  [name], MD  Electrophysiologist:  None   Referring MD: [name]*   Chief Complaint/Reason for Referral: Anomalous left main coronary artery.  History of Present Illness:    Kevin Combs is a 45 y.o. male with a history of recent admission to the hospital for left arm pain.  He was noted to have an abnormal EKG with diffuse ST elevations, and hypertensive urgency.  Code STEMI was initially called, however Dr. Burt Knack reviewed and felt not representative of STEMI.  ESR and CRP were minimally elevated and ECG showed diffuse ST elevations, concerning for pericarditis.  Patient started on treatment for pericarditis with ibuprofen and colchicine.  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  CT FFR performed with no flow limitation.  Exercise stress treadmill test performed as well with nuclear imaging showing no ischemic ECG changes (ECG was somewhat uninterpretable due to baseline ECG changes), and no perfusion abnormalities on nuclear imaging.  He was seen in consultation by cardiac surgery.  We determined that he was overall low risk, and surgical consultation was performed as an outpatient.  Patient had surgery 07/06/2020 with Dr. Melodie Bouillon. Anatomy more complex than anticipated, and plan for unroofing was transitioned to CABG x 2 with LIMA to LAD and reverse SVG to OM1.  Doing really well. Soreness in chest but otherwise doing well.   Past Medical History:  Diagnosis Date  . 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 Anxiety   .  HTN (hypertension)   . Pericarditis 05/09/2020    Past Surgical History:  Procedure Laterality Date  . CORONARY ARTERY BYPASS GRAFT N/A 07/06/2020   Procedure: CORONARY ARTERY BYPASS GRAFTING (CABG) TIMES TWO USING LEFT INTERNAL MAMMARY ARTERY AND RIGHT GREATER SAPHENOUS VEIN HARVESTED ENDOSCOPICALLY, CORONARY UNROOFING WITH RESUSPENSION OF THE AORTIC VALVE;  Surgeon: [name], MD;  Location: [place];  Service: Open Heart Surgery;  Laterality: N/A;  . KNEE SURGERY Right 1992  . LAPAROSCOPIC APPENDECTOMY N/A 01/28/2015   Procedure: APPENDECTOMY LAPAROSCOPIC;  Surgeon: [name], MD;  Location: [facility];  Service: General;  Laterality: N/A;  . TEE WITHOUT CARDIOVERSION N/A 07/06/2020   Procedure: TRANSESOPHAGEAL ECHOCARDIOGRAM (TEE);  Surgeon: [name], MD;  Location: [place];  Service: Open Heart Surgery;  Laterality: N/A;    Current Medications: Current Meds  Medication Sig  . acetaminophen (TYLENOL) 500 MG tablet Take 1,000 mg by mouth every 6 (six) hours as needed (for pain).  Marland Kitchen aspirin EC 325 MG EC tablet Take 1 tablet (325 mg total) by mouth daily.  Marland Kitchen atorvastatin (LIPITOR) 80 MG tablet Take 1 tablet (80 mg total) by mouth daily.  . metoprolol tartrate (LOPRESSOR) 25 MG tablet Take 0.5 tablets (12.5 mg total) by mouth 2 (two) times daily.  . promethazine (PHENERGAN) 12.5 MG tablet Take 1 tablet (12.5 mg total) by mouth every 6 (six) hours as needed for up to 3 days for nausea or vomiting.     Allergies:   Patient has no known allergies.   Social History   Tobacco Use  . Smoking status:  Former Smoker    Types: Cigars  . Smokeless tobacco: Never Used  Vaping Use  . Vaping Use: Never used  Substance Use Topics  . Alcohol use: Yes    Alcohol/week: 14.0 standard drinks    Types: 14 Standard drinks or equivalent per week    Comment: vodka  . Drug use: Yes    Frequency: 14.0 times per week    Types: Marijuana     Family History: The patient's family  history includes Crohn's disease in his daughter; Hypertension in his mother.  ROS:   Please see the history of present illness.    All other systems reviewed and are negative.  EKGs/Labs/Other Studies Reviewed:    The following studies were reviewed today:  EKG:  NSR with early repolarization and anteroseptal T wave inversions.  Recent Labs: 07/04/2020: ALT 31 07/07/2020: Magnesium 2.7 07/09/2020: BUN 12; Creatinine, Ser 1.10; Hemoglobin 9.8; Platelets 121; Potassium 3.8; Sodium 135  Recent Lipid Panel    Component Value Date/Time   CHOL 191 05/10/2020 0300   TRIG 52 05/10/2020 0300   HDL 76 05/10/2020 0300   CHOLHDL 2.5 05/10/2020 0300   VLDL 10 05/10/2020 0300   LDLCALC 105 (H) 05/10/2020 0300    Physical Exam:    VS:  BP 102/70 (BP Location: Left Arm, Patient Position: Sitting, Cuff Size: Large)   Pulse 66   Ht 5' 11" (1.803 m)   Wt 215 lb (97.5 kg)   BMI 29.99 kg/m     Wt Readings from Last 5 Encounters:  07/26/20 215 lb (97.5 kg)  07/15/20 207 lb 8 oz (94.1 kg)  07/10/20 213 lb 8 oz (96.8 kg)  07/04/20 225 lb 6.4 oz (102.2 kg)  06/03/20 220 lb (99.8 kg)    Constitutional: No acute distress Eyes: sclera non-icteric, normal conjunctiva and lids ENMT: normal dentition, moist mucous membranes Cardiovascular: regular rhythm, normal rate, no murmurs. S1 and S2 normal. Radial pulses normal bilaterally. No jugular venous distention.  Respiratory: clear to auscultation bilaterally GI : normal bowel sounds, soft and nontender. No distention.   MSK: extremities warm, well perfused. No edema.  NEURO: grossly nonfocal exam, moves all extremities. PSYCH: alert and oriented x 3, normal mood and affect.   ASSESSMENT:    1. Anomalous left coronary artery   2. S/P CABG x 2    PLAN:    Anomalous left coronary artery  S/P CABG x 2 - Plan: EKG 12-Lead   Doing really well post operatively. Continue ASA 325 mg daily until follow up with Dr. Kipp Brood. Continue BB  HTN -  now on metoprolol with normal BP, overall stable.    Acute idiopathic pericarditis -suspect chest pain was more representative of coronary anomaly, and ECG may represent early repolarization pattern more so than pericarditis.  Antiinflammatory agents stopped, appropriately so.  Total time of encounter: 30 minutes total time of encounter, including 20 minutes spent in face-to-face patient care on the date of this encounter. This time includes coordination of care and counseling regarding above mentioned problem list. Remainder of non-face-to-face time involved reviewing chart documents/testing relevant to the patient encounter and documentation in the medical record. I have independently reviewed documentation from referring provider.   Cherlynn Kaiser, MD, [facility]    Medication Adjustments/Labs and Tests Ordered: Current medicines are reviewed at length with the patient today.  Concerns regarding medicines are outlined above.   Orders Placed This Encounter  Procedures  . EKG 12-Lead  No orders of the defined types were placed in this encounter.   Patient Instructions  Medication Instructions:  YOU MAY DECREASE YOUR ASPIRIN DOSE TO 81MG AFTER FOLLOW UP APPOINTMENT WITH Dr. Kipp Brood ON 3/25- PLEASE REVIEW THIS WITH DR. Kipp Brood BEFORE DECREASING DOSE.  *If you need a refill on your cardiac medications before your next appointment, please call your pharmacy*  Follow-Up: At [facility], you and your health needs are our priority.  As part of our continuing mission to provide you with exceptional heart care, we have created designated Provider Care Teams.  These Care Teams include your primary Cardiologist (physician) and Advanced Practice Providers (APPs -  Physician Assistants and Nurse Practitioners) who all work together to provide you with the care you need, when you need it.  We recommend signing up for the patient portal called "MyChart".  Sign up  information is provided on this After Visit Summary.  MyChart is used to connect with patients for Virtual Visits (Telemedicine).  Patients are able to view lab/test results, encounter notes, upcoming appointments, etc.  Non-urgent messages can be sent to your provider as well.   To learn more about what you can do with MyChart, go to [email or web address].    Your next appointment:   MAY 10th at 10:20am   The format for your next appointment:   In Person  Provider:   Cherlynn Kaiser, MD

## 2020-08-12 ENCOUNTER — Other Ambulatory Visit: Payer: Self-pay | Admitting: Thoracic Surgery (Cardiothoracic Vascular Surgery)

## 2020-08-12 ENCOUNTER — Ambulatory Visit
Admission: RE | Admit: 2020-08-12 | Discharge: 2020-08-12 | Disposition: A | Discharge status: Home / Self Care | Payer: 59 | Source: Ambulatory Visit | Attending: Thoracic Surgery (Cardiothoracic Vascular Surgery) | Admitting: Thoracic Surgery (Cardiothoracic Vascular Surgery)

## 2020-08-12 ENCOUNTER — Other Ambulatory Visit: Payer: Self-pay | Admitting: *Deleted

## 2020-08-12 ENCOUNTER — Ambulatory Visit (INDEPENDENT_AMBULATORY_CARE_PROVIDER_SITE_OTHER): Payer: Self-pay | Admitting: Thoracic Surgery (Cardiothoracic Vascular Surgery)

## 2020-08-12 ENCOUNTER — Encounter: Payer: Self-pay | Admitting: Thoracic Surgery (Cardiothoracic Vascular Surgery)

## 2020-08-12 ENCOUNTER — Other Ambulatory Visit: Payer: Self-pay

## 2020-08-12 VITALS — BP 118/81 | HR 60 | Resp 20 | Ht 71.0 in | Wt 218.0 lb

## 2020-08-12 DIAGNOSIS — Z951 Presence of aortocoronary bypass graft: Secondary | ICD-10-CM

## 2020-08-12 MED ORDER — TRAMADOL HCL 50 MG PO TABS: 50.0000 mg | ORAL_TABLET | Freq: Four times a day (QID) | ORAL | 0 refills | Status: DC | PRN

## 2020-08-12 MED ORDER — METHOCARBAMOL 500 MG PO TABS: 500.0000 mg | ORAL_TABLET | Freq: Three times a day (TID) | ORAL | 0 refills | Status: DC

## 2020-08-12 NOTE — Progress Notes (Signed)
      [address]       [place] [address]             [phone]        SILUS LANZO [facility] Medical Record #[id number] Date of Birth: November 24, 1975  Referring: Parke Poisson, MD Primary Care: Sharlene Dory, DO Primary Cardiologist:Gayatri Wynell Balloon, MD  Reason for visit:   follow-up  History of Present Illness:     Mr. Gittleman presents for his 1 month follow-up appointment.  Overall he is doing well.  He denies any symptoms similar prior to surgery.  He does complain of some chest wall musculoskeletal pain.  This is mostly on the right side and occasionally on the left.  Physical Exam: BP 118/81 (BP Location: Left Arm, Patient Position: Sitting)   Pulse 60   Resp 20   Ht 5\' 11"  (1.803 m)   Wt 218 lb (98.9 kg)   SpO2 99% Comment: RA  BMI 30.40 kg/m   Alert NAD Incision clean.  Sternum stable Abdomen soft, ND No peripheral edema   Diagnostic Studies & Laboratory data: CXR: Clear     Assessment / Plan:   45 year old male status post CABG for repair of anomalous origin of the left main coronary artery.  He is currently doing well.  I have given him a prescription of Robaxin as well as tramadol for his musculoskeletal pain.  I will submit a referral for him to follow-up in cardiac rehab.  Continue medical management for now.   54 08/12/2020 12:06 PM

## 2020-08-12 NOTE — Progress Notes (Signed)
Order for referral to Cardiac Rehab placed per Dr. Cliffton Asters.

## 2020-08-15 ENCOUNTER — Other Ambulatory Visit: Payer: Self-pay | Admitting: Surgical

## 2020-09-07 ENCOUNTER — Other Ambulatory Visit: Payer: Self-pay | Admitting: Surgical

## 2020-09-12 ENCOUNTER — Ambulatory Visit: Payer: 59 | Admitting: Internal Medicine

## 2020-09-26 ENCOUNTER — Telehealth (HOSPITAL_COMMUNITY): Payer: Self-pay | Admitting: Pharmacy Technician

## 2020-09-27 ENCOUNTER — Ambulatory Visit: Payer: 59 | Admitting: Internal Medicine

## 2020-09-27 ENCOUNTER — Telehealth (HOSPITAL_COMMUNITY): Payer: Self-pay | Admitting: *Deleted

## 2020-09-27 NOTE — Progress Notes (Incomplete)
Cardiology Office Note:    Date:  09/27/2020   ID:  Kevin Combs, DOB December 17, 1975, MRN [id number]  PCP:  [name], DO  Cardiologist:  [name], MD  Electrophysiologist:  None   Referring MD: [name]*   Chief Complaint/Reason for Referral: Anomalous left main coronary artery.  History of Present Illness:    Kevin Combs is a 45 y.o. male with a history of recent admission to the hospital for left arm pain.  He was noted to have an abnormal EKG with diffuse ST elevations, and hypertensive urgency.  Code STEMI was initially called, however Dr. Burt Knack reviewed and felt not representative of STEMI.  ESR and CRP were minimally elevated and ECG showed diffuse ST elevations, concerning for pericarditis.  Patient started on treatment for pericarditis with ibuprofen and colchicine.  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  CT FFR performed with no flow limitation.  Exercise stress treadmill test performed as well with nuclear imaging showing no ischemic ECG changes (ECG was somewhat uninterpretable due to baseline ECG changes), and no perfusion abnormalities on nuclear imaging.  He was seen in consultation by cardiac surgery.  We determined that he was overall low risk, and surgical consultation was performed as an outpatient.  Patient had surgery 07/06/2020 with Dr. Melodie Bouillon. Anatomy more complex than anticipated, and plan for unroofing was transitioned to CABG x 2 with LIMA to LAD and reverse SVG to OM1.  Doing really well. Soreness in chest but otherwise doing well.   Today, he  He denies any chest pain, shortness of breath, or palpitations. No pre-syncope,  syncope, or lightheadedness/dizziness to note. Also has no lower extremity edema, orthopnea or PND.    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   Anxiety    HTN (hypertension)    Pericarditis 05/09/2020    Past Surgical History:  Procedure Laterality Date   CORONARY ARTERY BYPASS GRAFT N/A 07/06/2020   Procedure: CORONARY ARTERY BYPASS GRAFTING (CABG) TIMES TWO USING LEFT INTERNAL MAMMARY ARTERY AND RIGHT GREATER SAPHENOUS VEIN HARVESTED ENDOSCOPICALLY, CORONARY UNROOFING WITH RESUSPENSION OF THE AORTIC VALVE;  Surgeon: [name], MD;  Location: [place];  Service: Open Heart Surgery;  Laterality: N/A;   KNEE SURGERY Right 1992   LAPAROSCOPIC APPENDECTOMY N/A 01/28/2015   Procedure: APPENDECTOMY LAPAROSCOPIC;  Surgeon: [name], MD;  Location: [facility];  Service: General;  Laterality: N/A;   TEE WITHOUT CARDIOVERSION N/A 07/06/2020   Procedure: TRANSESOPHAGEAL ECHOCARDIOGRAM (TEE);  Surgeon: [name], MD;  Location: [place];  Service: Open Heart Surgery;  Laterality: N/A;    Current Medications: No outpatient medications have been marked as taking for the 09/27/20 encounter (Appointment) with [name], MD.     Allergies:   Patient has no known allergies.   Social History   Tobacco Use   Smoking status: Former Smoker    Types: Cigars   Smokeless tobacco: Never Used  Scientific laboratory technician Use: Never used  Substance Use Topics   Alcohol use: Yes    Alcohol/week: 14.0 standard drinks    Types: 14 Standard drinks or equivalent per week    Comment: vodka   Drug use:  Yes    Frequency: 14.0 times per week    Types: Marijuana     Family History: The patient's family history includes Crohn's disease in his daughter; Hypertension in his mother.  ROS:   Please see the history of present illness.    (+) All other systems reviewed and are negative.  EKGs/Labs/Other Studies Reviewed:    The  following studies were reviewed today:  TEE 6/96/7893: Complications: No known complications during this procedure.  POST-OP IMPRESSIONS  Overall, there were no significant changes from pre-bypass.  - Left Ventricle: The left ventricle is unchanged from pre-bypass.  - Right Ventricle: The right ventricle appears unchanged from pre-bypass.  - Aorta: The aorta appears unchanged from pre-bypass.  - Left Atrium: The left atrium appears unchanged from pre-bypass.  - Left Atrial Appendage: The left atrial appendage appears unchanged from  pre-bypass.  - Aortic Valve: The aortic valve appears unchanged from pre-bypass.  - Mitral Valve: The mitral valve appears unchanged from pre-bypass.  - Tricuspid Valve: The tricuspid valve appears unchanged from pre-bypass.  - Interatrial Septum: The interatrial septum appears unchanged from  pre-bypass.  - Interventricular Septum: The interventricular septum appears unchanged  from  pre-bypass.  - Pericardium: The pericardium appears unchanged from pre-bypass.  Echo 05/09/20: 1. Left ventricular ejection fraction, by estimation, is 55 to 60%. The  left ventricle has normal function. The left ventricle has no regional  wall motion abnormalities. There is mild left ventricular hypertrophy.  Left ventricular diastolic parameters  are consistent with Grade I diastolic dysfunction (impaired relaxation).  2. Right ventricular systolic function is normal. The right ventricular  size is normal. Tricuspid regurgitation signal is inadequate for assessing  PA pressure.  3. The mitral valve is abnormal. Trivial mitral valve regurgitation.  4. The aortic valve is tricuspid. Aortic valve regurgitation is not  visualized.  5. The inferior vena cava is normal in size with greater than 50%  respiratory variability, suggesting right atrial pressure of 3 mmHg.   EKG:   09/27/2020: *** 07/26/2020: NSR with early repolarization and anteroseptal T wave  inversions.  Recent Labs: 07/04/2020: ALT 31 07/07/2020: Magnesium 2.7 07/09/2020: BUN 12; Creatinine, Ser 1.10; Hemoglobin 9.8; Platelets 121; Potassium 3.8; Sodium 135  Recent Lipid Panel    Component Value Date/Time   CHOL 191 05/10/2020 0300   TRIG 52 05/10/2020 0300   HDL 76 05/10/2020 0300   CHOLHDL 2.5 05/10/2020 0300   VLDL 10 05/10/2020 0300   LDLCALC 105 (H) 05/10/2020 0300    Physical Exam:    VS:  There were no vitals taken for this visit.    Wt Readings from Last 5 Encounters:  08/12/20 218 lb (98.9 kg)  07/26/20 215 lb (97.5 kg)  07/15/20 207 lb 8 oz (94.1 kg)  07/10/20 213 lb 8 oz (96.8 kg)  07/04/20 225 lb 6.4 oz (102.2 kg)    Constitutional: No acute distress Eyes: sclera non-icteric, normal conjunctiva and lids ENMT: normal dentition, moist mucous membranes Cardiovascular: regular rhythm, normal rate, no murmurs. S1 and S2 normal. Radial pulses normal bilaterally. No jugular venous distention.  Respiratory: clear to auscultation bilaterally GI : normal bowel sounds, soft and nontender. No distention.   MSK: extremities warm, well perfused. No edema.  NEURO: grossly nonfocal exam, moves all extremities. PSYCH: alert and oriented x 3, normal mood and affect.   ASSESSMENT:    No diagnosis found. PLAN:    No diagnosis found.   Doing really well post operatively. Continue ASA 325  mg daily until follow up with Dr. Kipp Brood. Continue BB  HTN - now on metoprolol with normal BP, overall stable.    Acute idiopathic pericarditis -suspect chest pain was more representative of coronary anomaly, and ECG may represent early repolarization pattern more so than pericarditis.  Antiinflammatory agents stopped, appropriately so.  Total time of encounter: *** minutes total time of encounter, including *** minutes spent in face-to-face patient care on the date of this encounter. This time includes coordination of care and counseling regarding above mentioned problem  list. Remainder of non-face-to-face time involved reviewing chart documents/testing relevant to the patient encounter and documentation in the medical record. I have independently reviewed documentation from referring provider.   Cherlynn Kaiser, MD, [facility]    Medication Adjustments/Labs and Tests Ordered: Current medicines are reviewed at length with the patient today.  Concerns regarding medicines are outlined above.   No orders of the defined types were placed in this encounter.   No orders of the defined types were placed in this encounter.   There are no Patient Instructions on file for this visit.    I,Mathew Stumpf,acting as a Education administrator for [name], MD.,have documented all relevant documentation on the behalf of [name], MD,as directed by  [name], MD while in the presence of [name], MD.  ***

## 2020-09-27 NOTE — Telephone Encounter (Signed)
Left message to call cardiac rehab. Called to confirm orientation appointment for Thursday.Gladstone Lighter, RN,BSN 09/27/2020 1:33 PM

## 2020-09-27 NOTE — Telephone Encounter (Signed)
Cardiac Rehab Medication Review by a Pharmacist  Does the patient  feel that his/her medications are working for him/her?  yes  Has the patient been experiencing any side effects to the medications prescribed?  no  Does the patient measure his/her own blood pressure or blood glucose at home?  no   Does the patient have any problems obtaining medications due to transportation or finances?   no  Understanding of regimen: good Understanding of indications: good Potential of compliance: fair   Pharmacist Intervention: Patient reports he is no longer taking aspirin 325 mg or atorvastatin 80 mg because it was not recommended to be continued at prior appointment. Do not see documentation for these medications to stop and believe he may have just run out of refills. Will reach out to MD to confirm and have refills sent if patient is to continue on these medications.    Kinnie Feil, PharmD PGY1 Acute Care Pharmacy Resident 09/27/2020 2:52 PM

## 2020-09-28 ENCOUNTER — Telehealth (HOSPITAL_COMMUNITY): Payer: Self-pay | Admitting: *Deleted

## 2020-09-28 NOTE — Telephone Encounter (Signed)
Left message to call cardiac rehab.Gladstone Lighter, RN,BSN 09/28/2020 3:56 PM

## 2020-09-29 ENCOUNTER — Telehealth: Payer: Self-pay | Admitting: Internal Medicine

## 2020-09-29 ENCOUNTER — Telehealth (HOSPITAL_COMMUNITY): Payer: Self-pay

## 2020-09-29 ENCOUNTER — Inpatient Hospital Stay (HOSPITAL_COMMUNITY): Admission: RE | Admit: 2020-09-29 | Payer: 59 | Source: Ambulatory Visit

## 2020-09-29 ENCOUNTER — Telehealth (HOSPITAL_COMMUNITY): Payer: Self-pay | Admitting: *Deleted

## 2020-09-29 NOTE — Telephone Encounter (Signed)
Returned call to patient who states that he accidentally got his appointment 5/10 mixed up with his cardiac rehab appointment today on 5/12. Patient states that he needs clearance to return to work either with restrictions or without restrictions as his FMLA is up 5/17.   Advised patient that I would forward message to Dr. Jacques Navy for her to review when back in office. Patient verbalized understanding.

## 2020-09-29 NOTE — Telephone Encounter (Addendum)
.   Left message to call cardiac rehab.Will cancel orientation appointment. Gladstone Lighter, RN,BSN 09/29/2020 1:28 PM

## 2020-09-29 NOTE — Telephone Encounter (Signed)
Returned call to patient, no DOD appointments available. Made patient an appointment for Monday 5/16 with Dr. Jacques Navy at 10am. Patient made aware and verbalized understanding.

## 2020-09-29 NOTE — Telephone Encounter (Signed)
Pt called in regards to his missed appt Tuesday 09/27/20, pt thought the appt was today, I offered him another appt but he states he is supposed to return to work on 10/03/20 and he needs to be released by Dr. Jacques Navy.

## 2020-09-29 NOTE — Telephone Encounter (Signed)
Attempted to call patient, left message for patient to call back to office.   

## 2020-09-29 NOTE — Telephone Encounter (Signed)
Pt no showed to his cardiac rehab orientation appt, canceled his remaining cardiac rehab sessions. Will contact pt at a later time to see if he wants to reschedule cardiac rehab.

## 2020-10-03 ENCOUNTER — Ambulatory Visit (HOSPITAL_COMMUNITY): Payer: 59

## 2020-10-03 ENCOUNTER — Other Ambulatory Visit: Payer: Self-pay

## 2020-10-03 ENCOUNTER — Encounter: Payer: Self-pay | Admitting: Internal Medicine

## 2020-10-03 ENCOUNTER — Ambulatory Visit (INDEPENDENT_AMBULATORY_CARE_PROVIDER_SITE_OTHER): Payer: 59 | Admitting: Internal Medicine

## 2020-10-03 VITALS — BP 104/78 | HR 57 | Ht 71.0 in | Wt 218.2 lb

## 2020-10-03 DIAGNOSIS — Q245 Malformation of coronary vessels: Secondary | ICD-10-CM

## 2020-10-03 DIAGNOSIS — Z951 Presence of aortocoronary bypass graft: Secondary | ICD-10-CM | POA: Diagnosis not present

## 2020-10-03 MED ORDER — ASPIRIN EC 81 MG PO TBEC: 81.0000 mg | DELAYED_RELEASE_TABLET | Freq: Every day | ORAL | 3 refills | Status: AC

## 2020-10-03 MED ORDER — ATORVASTATIN CALCIUM 80 MG PO TABS: 80.0000 mg | ORAL_TABLET | Freq: Every day | ORAL | 3 refills | Status: DC

## 2020-10-03 MED ORDER — METOPROLOL TARTRATE 25 MG PO TABS: 12.5000 mg | ORAL_TABLET | Freq: Two times a day (BID) | ORAL | 3 refills | Status: DC

## 2020-10-03 NOTE — Patient Instructions (Signed)
Medication Instructions:  START: Aspirin 81mg  DAILY CONTINUE: ATORVASTATIN 80mg  DAILY CONTINUE: METOPROLOL TARTRATE 12.5mg  TWICE DAILY *If you need a refill on your cardiac medications before your next appointment, please call your pharmacy*  Lab Work: PLEASE RETURN FOR FASTING LIPID PANEL- NO APPOINTMENT NEEDED- LAB IS OPEN from 8am to 4pm Monday -Friday  If you have labs (blood work) drawn today and your tests are completely normal, you will receive your results only by: Tuesday MyChart Message (if you have MyChart) OR . A paper copy in the mail If you have any lab test that is abnormal or we need to change your treatment, we will call you to review the results.  Follow-Up: At [facility], you and your health needs are our priority.  As part of our continuing mission to provide you with exceptional heart care, we have created designated Provider Care Teams.  These Care Teams include your primary Cardiologist (physician) and Advanced Practice Providers (APPs -  Physician Assistants and Nurse Practitioners) who all work together to provide you with the care you need, when you need it.  We recommend signing up for the patient portal called "MyChart".  Sign up information is provided on this After Visit Summary.  MyChart is used to connect with patients for Virtual Visits (Telemedicine).  Patients are able to view lab/test results, encounter notes, upcoming appointments, etc.  Non-urgent messages can be sent to your provider as well.   To learn more about what you can do with MyChart, go to Mar[facility] Kitchen.    Your next appointment:   6 month(s)  The format for your next appointment:   In Person  Provider:   [facility], MD

## 2020-10-03 NOTE — Progress Notes (Signed)
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  The patient was seen by Dr. Cliffton Asters and myself in March and was doing quite well.  Today he continues to feel well and has deferred cardiac rehab given his inability to get into cardiac rehab right away and active lifestyle with no chest pain or shortness of breath.  He was unclear whether he should continue his prescriptions given that refills were not available.  We discussed continuing aspirin, atorvastatin, and metoprolol.  Past Medical History:  Diagnosis Date  . 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 Anxiety   . HTN (hypertension)   . Pericarditis 05/09/2020    Past Surgical History:  Procedure Laterality Date  . CORONARY ARTERY BYPASS GRAFT N/A 07/06/2020   Procedure: CORONARY ARTERY BYPASS GRAFTING (CABG) TIMES TWO USING LEFT INTERNAL MAMMARY ARTERY AND RIGHT GREATER SAPHENOUS VEIN HARVESTED ENDOSCOPICALLY, CORONARY UNROOFING WITH RESUSPENSION OF THE AORTIC VALVE;  Surgeon: [name], MD;  Location: [facility];  Service: Open Heart Surgery;  Laterality: N/A;  . KNEE SURGERY Right 1992  . LAPAROSCOPIC APPENDECTOMY N/A 01/28/2015   Procedure: APPENDECTOMY LAPAROSCOPIC;  Surgeon: Claud Kelp, MD;   Location: [facility];  Service: General;  Laterality: N/A;  . TEE WITHOUT CARDIOVERSION N/A 07/06/2020   Procedure: TRANSESOPHAGEAL ECHOCARDIOGRAM (TEE);  Surgeon: [name], MD;  Location: [facility];  Service: Open Heart Surgery;  Laterality: N/A;    Current Medications: Current Meds  Medication Sig  . aspirin EC 81 MG tablet Take 1 tablet (81 mg total) by mouth daily. Swallow whole.  . traMADol (ULTRAM) 50 MG tablet Take 1 tablet (50 mg total) by mouth every 6 (six) hours as needed. (Patient taking differently: Take 50 mg by mouth every 6 (six) hours as needed for moderate pain.)     Allergies:   Patient has no known allergies.   Social History   Tobacco Use  . Smoking status: Former Smoker    Types: Cigars  . Smokeless tobacco: Never Used  Vaping Use  . Vaping Use: Never used  Substance Use Topics  . Alcohol use: Yes    Alcohol/week: 14.0 standard drinks    Types: 14 Standard drinks or equivalent per week    Comment: vodka  . Drug use: Yes    Frequency: 14.0 times per week    Types: Marijuana     Family History: The patient's family history includes Crohn's disease in his daughter; Hypertension in his mother.  ROS:   Please see the history of present illness.    All other systems reviewed and are negative.  EKGs/Labs/Other Studies Reviewed:    The following studies were reviewed today:  EKG: Sinus bradycardia with a rate of 57, early repolarization pattern  Recent Labs: 07/04/2020: ALT  31 07/07/2020: Magnesium 2.7 07/09/2020: BUN 12; Creatinine, Ser 1.10; Hemoglobin 9.8; Platelets 121; Potassium 3.8; Sodium 135  Recent Lipid Panel    Component Value Date/Time   CHOL 191 05/10/2020 0300   TRIG 52 05/10/2020 0300   HDL 76 05/10/2020 0300   CHOLHDL 2.5 05/10/2020 0300   VLDL 10 05/10/2020 0300   LDLCALC 105 (H) 05/10/2020 0300    Physical Exam:    VS:  BP 104/78 (BP Location: Left Arm, Patient Position: Sitting, Cuff Size: Normal)   Pulse (!) 57   Ht 5'  11" (1.803 m)   Wt 218 lb 3.2 oz (99 kg)   SpO2 100%   BMI 30.43 kg/m     Wt Readings from Last 5 Encounters:  10/03/20 218 lb 3.2 oz (99 kg)  08/12/20 218 lb (98.9 kg)  07/26/20 215 lb (97.5 kg)  07/15/20 207 lb 8 oz (94.1 kg)  07/10/20 213 lb 8 oz (96.8 kg)    Constitutional: No acute distress Eyes: sclera non-icteric, normal conjunctiva and lids ENMT: normal dentition, moist mucous membranes Cardiovascular: regular rhythm, normal rate, no murmurs. S1 and S2 normal. Radial pulses normal bilaterally. No jugular venous distention.  Sternotomy is well-healed. Respiratory: clear to auscultation bilaterally GI : normal bowel sounds, soft and nontender. No distention.   MSK: extremities warm, well perfused. No edema.  NEURO: grossly nonfocal exam, moves all extremities. PSYCH: alert and oriented x 3, normal mood and affect.   ASSESSMENT:    1. S/P CABG x 2   2. Anomalous left coronary artery    PLAN:    S/P CABG x 2 - Plan: Lipid panel  Anomalous left coronary artery - Plan: Lipid panel  He is doing very well status post treatment of anomalous left coronary artery with CABG.  He should continue taking 81 mg aspirin daily as well as metoprolol tartrate 12.5 mg twice daily.  He has bradycardic today after being off of metoprolol for 2 days, however I suspect his heart rates will increase with activity at work and beta-blockade would be indicated.  I have asked him to watch for hypotension and lightheadedness while on beta-blocker therapy.  His LDL well checked in December was 105, in the setting of bypass grafting and mildly elevated lipids, I would recheck his lipid panel and for now continue atorvastatin 80 mg daily.  If he has extremely well controlled lipids, we could consider dropping the dose of his atorvastatin, but will await lipid panel to do so.  He needs a note to return to work, at this time from both a surgical and cardiovascular perspective he seems to have no need for  restrictions.  I have asked him to take it slow at work initially and ask for help with more difficult test.  He should follow postsurgical sternotomy precautions as indicated by Dr. Lucilla Lame team.  Total time of encounter: 30 minutes total time of encounter, including 20 minutes spent in face-to-face patient care on the date of this encounter. This time includes coordination of care and counseling regarding above mentioned problem list. Remainder of non-face-to-face time involved reviewing chart documents/testing relevant to the patient encounter and documentation in the medical record. I have independently reviewed documentation from referring provider.   Weston Brass, MD, [facility]  [facility]   Shared Decision Making/Informed Consent:       Medication Adjustments/Labs and Tests Ordered: Current medicines are reviewed at length with the patient today.  Concerns regarding medicines are outlined above.  Orders Placed This Encounter  Procedures  . Lipid panel    Meds ordered this encounter  Medications  . atorvastatin (LIPITOR) 80 MG tablet    Sig: Take 1 tablet (80 mg total) by mouth daily.    Dispense:  90 tablet    Refill:  3  . metoprolol tartrate (LOPRESSOR) 25 MG tablet    Sig: Take 0.5 tablets (12.5 mg total) by mouth 2 (two) times daily.    Dispense:  90 tablet    Refill:  3  . aspirin EC 81 MG tablet    Sig: Take 1 tablet (81 mg total) by mouth daily. Swallow whole.    Dispense:  90 tablet    Refill:  3    Patient Instructions  Medication Instructions:  START: Aspirin 81mg  DAILY CONTINUE: ATORVASTATIN 80mg  DAILY CONTINUE: METOPROLOL TARTRATE 12.5mg  TWICE DAILY *If you need a refill on your cardiac medications before your next appointment, please call your pharmacy*  Lab Work: PLEASE RETURN FOR FASTING LIPID PANEL- NO APPOINTMENT NEEDED- LAB IS OPEN from 8am to 4pm Monday -Friday  If you have labs (blood work) drawn today and your tests are  completely normal, you will receive your results only by: Tuesday MyChart Message (if you have MyChart) OR . A paper copy in the mail If you have any lab test that is abnormal or we need to change your treatment, we will call you to review the results.  Follow-Up: At [facility], you and your health needs are our priority.  As part of our continuing mission to provide you with exceptional heart care, we have created designated Provider Care Teams.  These Care Teams include your primary Cardiologist (physician) and Advanced Practice Providers (APPs -  Physician Assistants and Nurse Practitioners) who all work together to provide you with the care you need, when you need it.  We recommend signing up for the patient portal called "MyChart".  Sign up information is provided on this After Visit Summary.  MyChart is used to connect with patients for Virtual Visits (Telemedicine).  Patients are able to view lab/test results, encounter notes, upcoming appointments, etc.  Non-urgent messages can be sent to your provider as well.   To learn more about what you can do with MyChart, go to Marland Kitchen.    Your next appointment:   6 month(s)  The format for your next appointment:   In Person  Provider:   [facility], MD

## 2020-10-05 ENCOUNTER — Ambulatory Visit (HOSPITAL_COMMUNITY): Payer: 59

## 2020-10-07 ENCOUNTER — Ambulatory Visit (HOSPITAL_COMMUNITY): Payer: 59

## 2020-10-10 ENCOUNTER — Ambulatory Visit (HOSPITAL_COMMUNITY): Payer: 59

## 2020-10-11 NOTE — Addendum Note (Signed)
Addended by: Bea Laura B on: 10/11/2020 01:24 PM   Modules accepted: Orders

## 2020-10-12 ENCOUNTER — Ambulatory Visit (HOSPITAL_COMMUNITY): Payer: 59

## 2020-10-14 ENCOUNTER — Ambulatory Visit (HOSPITAL_COMMUNITY): Payer: 59

## 2020-10-19 ENCOUNTER — Ambulatory Visit (HOSPITAL_COMMUNITY): Payer: 59

## 2020-10-21 ENCOUNTER — Ambulatory Visit (HOSPITAL_COMMUNITY): Payer: 59

## 2020-10-24 ENCOUNTER — Telehealth (HOSPITAL_COMMUNITY): Payer: Self-pay

## 2020-10-24 ENCOUNTER — Ambulatory Visit (HOSPITAL_COMMUNITY): Payer: 59

## 2020-10-24 NOTE — Telephone Encounter (Signed)
Called and spoke with pt in regards to CR, pt stated he is back to work. Work hours M-F, 7-4. He will check with his job to see if he can get off and contact CR back.  Will await pt phone call, if he does not call back within 2 weeks, will close referral.

## 2020-10-26 ENCOUNTER — Ambulatory Visit (HOSPITAL_COMMUNITY): Payer: 59

## 2020-10-28 ENCOUNTER — Ambulatory Visit (HOSPITAL_COMMUNITY): Payer: 59

## 2020-10-31 ENCOUNTER — Ambulatory Visit (HOSPITAL_COMMUNITY): Payer: 59

## 2020-11-02 ENCOUNTER — Ambulatory Visit (HOSPITAL_COMMUNITY): Payer: 59

## 2020-11-04 ENCOUNTER — Ambulatory Visit (HOSPITAL_COMMUNITY): Payer: 59

## 2020-11-07 ENCOUNTER — Ambulatory Visit (HOSPITAL_COMMUNITY): Payer: 59

## 2020-11-09 ENCOUNTER — Ambulatory Visit (HOSPITAL_COMMUNITY): Payer: 59

## 2020-11-10 ENCOUNTER — Telehealth (HOSPITAL_COMMUNITY): Payer: Self-pay

## 2020-11-10 NOTE — Telephone Encounter (Signed)
No response from pt.  Closed referral  

## 2020-11-11 ENCOUNTER — Ambulatory Visit (HOSPITAL_COMMUNITY): Payer: 59

## 2020-11-14 ENCOUNTER — Ambulatory Visit (HOSPITAL_COMMUNITY): Payer: 59

## 2020-11-16 ENCOUNTER — Ambulatory Visit (HOSPITAL_COMMUNITY): Payer: 59

## 2020-11-18 ENCOUNTER — Ambulatory Visit (HOSPITAL_COMMUNITY): Payer: 59

## 2020-11-23 ENCOUNTER — Ambulatory Visit (HOSPITAL_COMMUNITY): Payer: 59

## 2020-11-25 ENCOUNTER — Ambulatory Visit (HOSPITAL_COMMUNITY): Payer: 59

## 2021-03-27 NOTE — Progress Notes (Signed)
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  Today, he is doing well. He does not have any major complaints. He has plans to schedule an appointment with Dr. Nani Ravens for routine follow-up.  He continues to work in the department of [employer] and is trying to increase his physical activity as well.  He has only been taking the metoprolol tartrate once daily.  We discussed increasing this to metoprolol succinate 25 mg daily which would be the total daily dose of metoprolol tartrate we had anticipated he would take.  I have asked him to report any dizziness, lightheadedness, or bradycardia or hypotension.  The patient denies chest pain, chest pressure, dyspnea at rest or with exertion, PND, orthopnea, or leg swelling. Denies cough, fever, chills, nausea, or vomiting. Denies syncope, presyncope, or snoring. Denies dizziness or lightheadedness.   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   Anxiety    HTN (hypertension)    Pericarditis 05/09/2020    Past Surgical History:  Procedure Laterality Date   CORONARY ARTERY BYPASS GRAFT N/A 07/06/2020   Procedure: CORONARY ARTERY BYPASS GRAFTING (CABG) TIMES TWO  USING LEFT INTERNAL MAMMARY ARTERY AND RIGHT GREATER SAPHENOUS VEIN HARVESTED ENDOSCOPICALLY, CORONARY UNROOFING WITH RESUSPENSION OF THE AORTIC VALVE;  Surgeon: [name], MD;  Location: [facility];  Service: Open Heart Surgery;  Laterality: N/A;   KNEE SURGERY Right 1992   LAPAROSCOPIC APPENDECTOMY N/A 01/28/2015   Procedure: APPENDECTOMY LAPAROSCOPIC;  Surgeon: [name], MD;  Location: [facility];  Service: General;  Laterality: N/A;   TEE WITHOUT CARDIOVERSION N/A 07/06/2020   Procedure: TRANSESOPHAGEAL ECHOCARDIOGRAM (TEE);  Surgeon: [name], MD;  Location: [facility];  Service: Open Heart Surgery;  Laterality: N/A;    Current Medications: Current Meds  Medication Sig   aspirin EC 81 MG tablet Take 1 tablet (81 mg total) by mouth daily. Swallow whole.   atorvastatin (LIPITOR) 80 MG tablet Take 1 tablet (80 mg total) by mouth daily.   traMADol (ULTRAM) 50 MG tablet Take 1 tablet (50 mg total) by mouth every 6 (six) hours as needed. (Patient taking differently: Take 50 mg by mouth every 6 (six) hours as needed for moderate pain.)   [DISCONTINUED] metoprolol tartrate (LOPRESSOR) 25 MG tablet Take 0.5 tablets (12.5 mg total) by mouth 2 (two) times daily.     Allergies:   Patient has no known allergies.   Social History   Tobacco Use   Smoking status: Former    Types: Cigars   Smokeless tobacco: Never  Vaping Use   Vaping Use: Never used  Substance Use Topics   Alcohol use: Yes    Alcohol/week: 14.0 standard drinks    Types: 14  Standard drinks or equivalent per week    Comment: vodka   Drug use: Yes    Frequency: 14.0 times per week    Types: Marijuana     Family History: The patient's family history includes Crohn's disease in his daughter; Hypertension in his mother.  ROS:   Please see the history of present illness.    All other systems reviewed and are negative.  EKGs/Labs/Other Studies Reviewed:    The following studies were reviewed today: Echo  Intraoperative TEE 07/06/20 POST-OP IMPRESSIONS  Overall, there were no significant changes from pre-bypass.  - Left Ventricle: The left ventricle is unchanged from pre-bypass.  - Right Ventricle: The right ventricle appears unchanged from pre-bypass.  - Aorta: The aorta appears unchanged from pre-bypass.  - Left Atrium: The left atrium appears unchanged from pre-bypass.  - Left Atrial Appendage: The left atrial appendage appears unchanged from pre-bypass.  - Aortic Valve: The aortic valve appears unchanged from pre-bypass.  - Mitral Valve: The mitral valve appears unchanged from pre-bypass.  - Tricuspid Valve: The tricuspid valve appears unchanged from pre-bypass.  - Interatrial Septum: The interatrial septum appears unchanged from  pre-bypass.  - Interventricular Septum: The interventricular septum appears unchanged  from pre-bypass.  - Pericardium: The pericardium appears unchanged from pre-bypass.   Myoview 05/11/20 Blood pressure demonstrated a normal response to exercise. The study is normal. This is a low risk study. The left ventricular ejection fraction is mildly decreased (45-54%). Normal resting and stress perfusion. No ischemia or infarction EF Estimated 46% but no RWMA and normal by TTE this admission   CT Coronary Fractional Flow 05/10/20 FINDINGS: FFRct analysis was performed on the original cardiac CT angiogram dataset. Diagrammatic representation of the FFRct analysis is provided in a separate PDF document in PACS. This dictation was created using the PDF document and an interactive 3D model of the results. 3D model is not available in the EMR/PACS. Normal FFR range is >0.80. Indeterminate (grey) zone is 0.76-0.80. 1. Left Main: FFR = 0.96 2. LAD: Proximal FFR = 0.94, mid FFR = 0.87, Distal FFR = 0.78 3. Ramus intermedius = proximal 0.96, distal 0.94 4. LCX: Proximal FFR = 0.94, distal FFR = 0.92 5. RCA: Proximal FFR = 0.96, mid FFR =0.93, distal FFR = 0.91   IMPRESSION: 1. CT FFR analysis showed no significant stenosis. No ischemia noted at rest with anomalous left main coronary artery with interarterial course.  CTA coronary 05/10/20 1. 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 Ostium has slit-like appearance, and there is mild systolic compression of the body of the left main coronary artery. Right dominant circulation. CT FFR will be performed and reported separately for ischemic evaluation. 2.  No evidence of CAD, CADRADS = 0. 3. Coronary calcium score of 0.  Echo 05/09/20 1. Left ventricular ejection fraction, by estimation, is 55 to 60%. The  left ventricle has normal function. The left ventricle has no regional  wall motion abnormalities. There is mild left ventricular hypertrophy.  Left ventricular diastolic parameters  are consistent with Grade I diastolic dysfunction (impaired relaxation).   2. Right ventricular systolic function is normal. The right ventricular  size is normal. Tricuspid regurgitation signal is inadequate for assessing  PA pressure.   3. The mitral valve is abnormal. Trivial mitral valve regurgitation.   4. The aortic valve is tricuspid. Aortic valve regurgitation is not  visualized.   5. The inferior vena cava  is normal in size with greater than 50%  respiratory variability, suggesting right atrial pressure of 3 mmHg.  EKG:  03/30/21: Sinus rhythm, rate 60 bpm, anterior T-wave abnormality 10/03/20: Sinus bradycardia with a rate of 57, early repolarization pattern  Recent Labs: 07/04/2020: ALT 31 07/07/2020: Magnesium 2.7 07/09/2020: BUN 12; Creatinine, Ser 1.10; Hemoglobin 9.8; Platelets 121; Potassium 3.8; Sodium 135  Recent Lipid Panel    Component Value Date/Time   CHOL 191 05/10/2020 0300   TRIG 52 05/10/2020 0300   HDL 76 05/10/2020 0300   CHOLHDL 2.5 05/10/2020 0300   VLDL 10 05/10/2020 0300   LDLCALC 105  (H) 05/10/2020 0300    Physical Exam:    VS:  BP (!) 146/98   Pulse 60   Ht 5\' 11"  (1.803 m)   Wt 228 lb 9.6 oz (103.7 kg)   SpO2 99%   BMI 31.88 kg/m     Wt Readings from Last 5 Encounters:  03/30/21 228 lb 9.6 oz (103.7 kg)  10/03/20 218 lb 3.2 oz (99 kg)  08/12/20 218 lb (98.9 kg)  07/26/20 215 lb (97.5 kg)  07/15/20 207 lb 8 oz (94.1 kg)    Constitutional: No acute distress Eyes: sclera non-icteric, normal conjunctiva and lids ENMT: normal dentition, moist mucous membranes Cardiovascular: regular rhythm, normal rate, no murmurs. S1 and S2 normal. Radial pulses normal bilaterally. No jugular venous distention. Sternotomy is well-healed. Respiratory: clear to auscultation bilaterally GI : normal bowel sounds, soft and nontender. No distention.   MSK: extremities warm, well perfused. No edema.  NEURO: grossly nonfocal exam, moves all extremities. PSYCH: alert and oriented x 3, normal mood and affect.   ASSESSMENT:    1. S/P CABG x 2   2. Anomalous left coronary artery   3. Hypertension, unspecified type   4. Acute idiopathic pericarditis   5. Medication management     PLAN:    S/P CABG x 2 - Plan: EKG 12-Lead Anomalous left coronary artery - Plan: EKG 12-Lead -Doing quite well without chest pain or limitation in activity.  Surgical incision well-healed with slight keloid but no bothersome feeling. -Continue aspirin 81 mg daily and beta-blocker.  Can continue statin.  Hypertension, unspecified type -Blood pressure mildly elevated today and at times at home it does the same.  He is only taking metoprolol tartrate once daily, to mildly intensify therapy but also maximize dosing of beta-blocker, will transition to metoprolol succinate 25 mg daily.  Acute idiopathic pericarditis-his initial presentation to the hospital seemed consistent with pericarditis due to ECG changes and type of pain, however more likely this was related to anomalous coronary artery.  No recurrence  of pain, not currently on antiinflammatory therapy.  Medication management -As above transition to Toprol-XL.    Total time of encounter: 30 minutes total time of encounter, including 20 minutes spent in face-to-face patient care on the date of this encounter. This time includes coordination of care and counseling regarding above mentioned problem list. Remainder of non-face-to-face time involved reviewing chart documents/testing relevant to the patient encounter and documentation in the medical record. I have independently reviewed documentation from referring provider.   07/17/20, MD, [facility]  [facility]   Shared Decision Making/Informed Consent:       Medication Adjustments/Labs and Tests Ordered: Current medicines are reviewed at length with the patient today.  Concerns regarding medicines are outlined above.   Orders Placed This Encounter  Procedures   EKG 12-Lead     Meds ordered  this encounter  Medications   metoprolol succinate (TOPROL-XL) 25 MG 24 hr tablet    Sig: Take 1 tablet (25 mg total) by mouth daily. Take with or immediately following a meal.    Dispense:  90 tablet    Refill:  3     Patient Instructions  Medication Instructions:  STOP METOPROLOL TARTRATE  START: METOPROLOL SUCCINATE 25mg  DAILY  *If you need a refill on your cardiac medications before your next appointment, please call your pharmacy*  Follow-Up: At [facility], you and your health needs are our priority.  As part of our continuing mission to provide you with exceptional heart care, we have created designated Provider Care Teams.  These Care Teams include your primary Cardiologist (physician) and Advanced Practice Providers (APPs -  Physician Assistants and Nurse Practitioners) who all work together to provide you with the care you need, when you need it.  We recommend signing up for the patient portal called "MyChart".  Sign up information is provided on this After  Visit Summary.  MyChart is used to connect with patients for Virtual Visits (Telemedicine).  Patients are able to view lab/test results, encounter notes, upcoming appointments, etc.  Non-urgent messages can be sent to your provider as well.   To learn more about what you can do with MyChart, go to [email or web address].    Your next appointment:   6 month(s)  The format for your next appointment:   In Person  Provider:   [name], MD {   Wilhemina Bonito as a scribe for [name], MD.,have documented all relevant documentation on the behalf of [name], MD,as directed by  [name], MD while in the presence of [name], MD.  I, [name], MD, have reviewed all documentation for this visit. The documentation on today's date of service for the exam, diagnosis, procedures, and orders are all accurate and complete.

## 2021-03-30 ENCOUNTER — Ambulatory Visit (INDEPENDENT_AMBULATORY_CARE_PROVIDER_SITE_OTHER): Payer: 59 | Admitting: Internal Medicine

## 2021-03-30 ENCOUNTER — Encounter: Payer: Self-pay | Admitting: Internal Medicine

## 2021-03-30 ENCOUNTER — Other Ambulatory Visit: Payer: Self-pay

## 2021-03-30 VITALS — BP 146/98 | HR 60 | Ht 71.0 in | Wt 228.6 lb

## 2021-03-30 DIAGNOSIS — Z951 Presence of aortocoronary bypass graft: Secondary | ICD-10-CM | POA: Diagnosis not present

## 2021-03-30 DIAGNOSIS — I10 Essential (primary) hypertension: Secondary | ICD-10-CM | POA: Diagnosis not present

## 2021-03-30 DIAGNOSIS — Q245 Malformation of coronary vessels: Secondary | ICD-10-CM

## 2021-03-30 DIAGNOSIS — I300 Acute nonspecific idiopathic pericarditis: Secondary | ICD-10-CM | POA: Diagnosis not present

## 2021-03-30 DIAGNOSIS — Z79899 Other long term (current) drug therapy: Secondary | ICD-10-CM

## 2021-03-30 MED ORDER — METOPROLOL SUCCINATE ER 25 MG PO TB24: 25.0000 mg | ORAL_TABLET | Freq: Every day | ORAL | 3 refills | Status: DC

## 2021-03-30 NOTE — Patient Instructions (Signed)
Medication Instructions:  STOP METOPROLOL TARTRATE  START: METOPROLOL SUCCINATE 25mg  DAILY  *If you need a refill on your cardiac medications before your next appointment, please call your pharmacy*  Follow-Up: At [facility], you and your health needs are our priority.  As part of our continuing mission to provide you with exceptional heart care, we have created designated Provider Care Teams.  These Care Teams include your primary Cardiologist (physician) and Advanced Practice Providers (APPs -  Physician Assistants and Nurse Practitioners) who all work together to provide you with the care you need, when you need it.  We recommend signing up for the patient portal called "MyChart".  Sign up information is provided on this After Visit Summary.  MyChart is used to connect with patients for Virtual Visits (Telemedicine).  Patients are able to view lab/test results, encounter notes, upcoming appointments, etc.  Non-urgent messages can be sent to your provider as well.   To learn more about what you can do with MyChart, go to [facility].    Your next appointment:   6 month(s)  The format for your next appointment:   In Person  Provider:   [email or web address], MD {

## 2021-04-03 ENCOUNTER — Ambulatory Visit: Payer: 59 | Admitting: Physician Assistant

## 2021-07-22 IMAGING — CR DG CHEST 2V
2 series · 2 of 2 positions shown · non-contrast
Comparison: 05/09/2020

CLINICAL DATA: Scheduled for heart surgery

EXAM:
CHEST - 2 VIEW

[w chest pa]
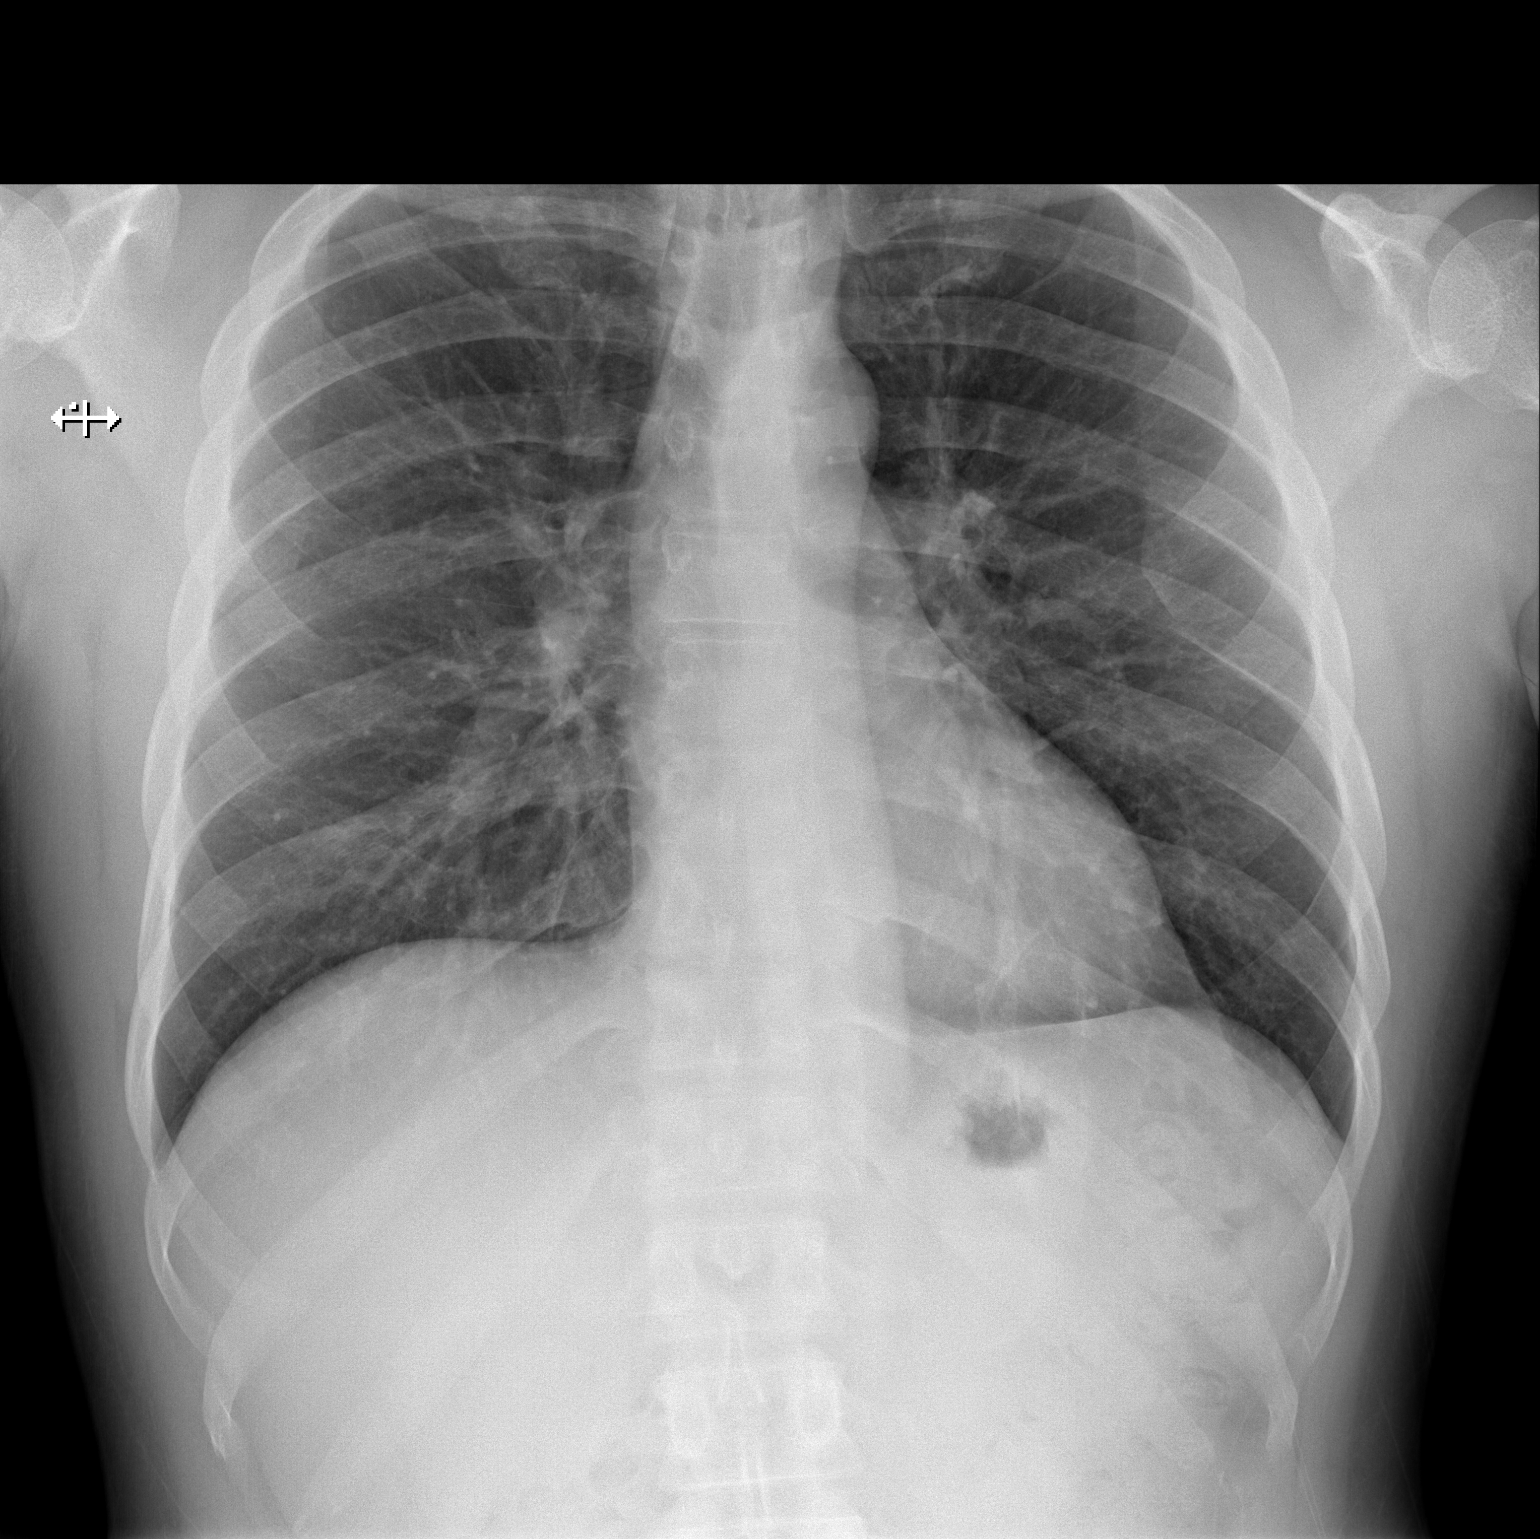

[w chest lat]
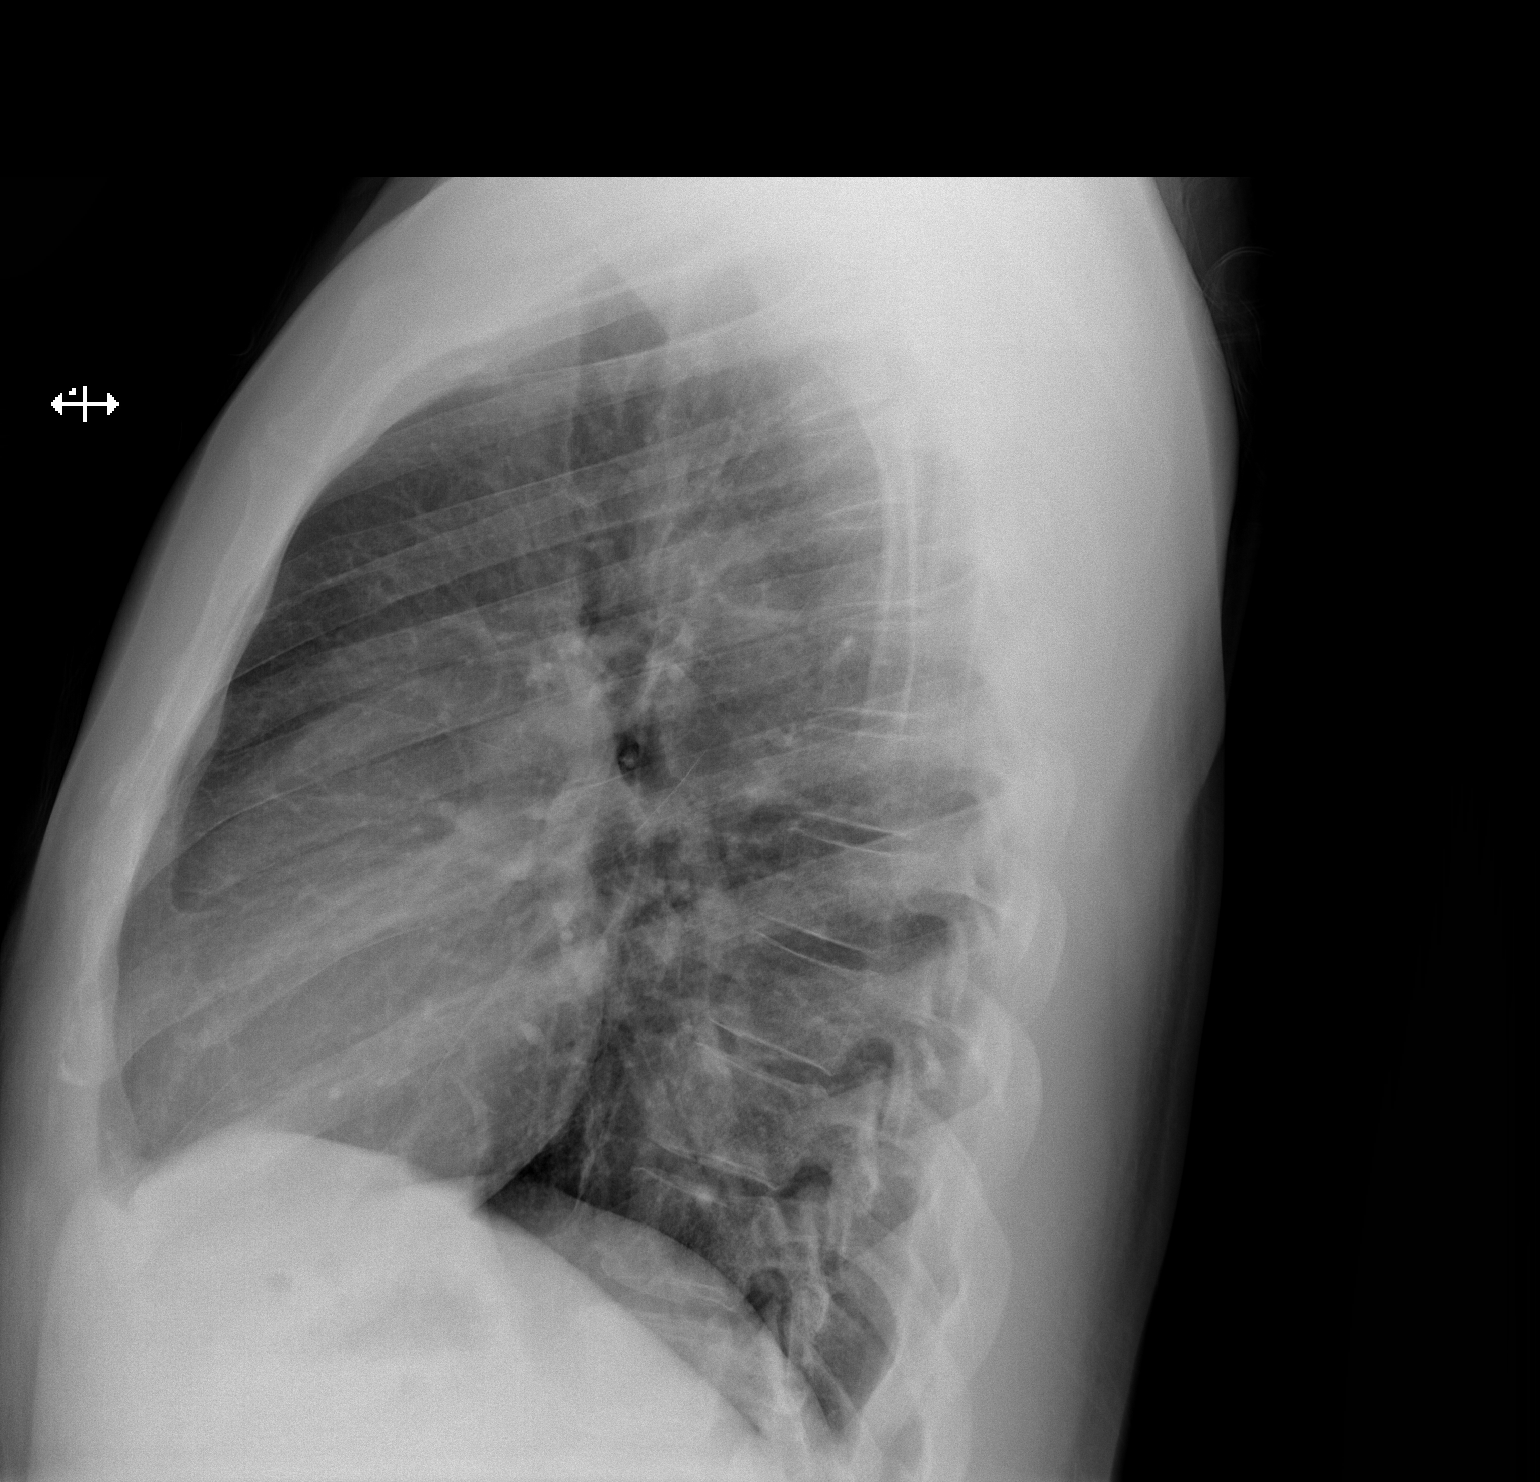

[2 of 2 positions shown; findings below may reference images not displayed]

FINDINGS: The heart size and mediastinal contours are within normal limits.
Both lungs are clear. The visualized skeletal structures are
unremarkable.
IMPRESSION: No active cardiopulmonary disease.

## 2021-08-30 IMAGING — CR DG CHEST 2V
2 series · 2 of 2 positions shown · non-contrast
Comparison: 07/08/2020

CLINICAL DATA: Status post CABG

EXAM:
CHEST - 2 VIEW

[w chest pa]
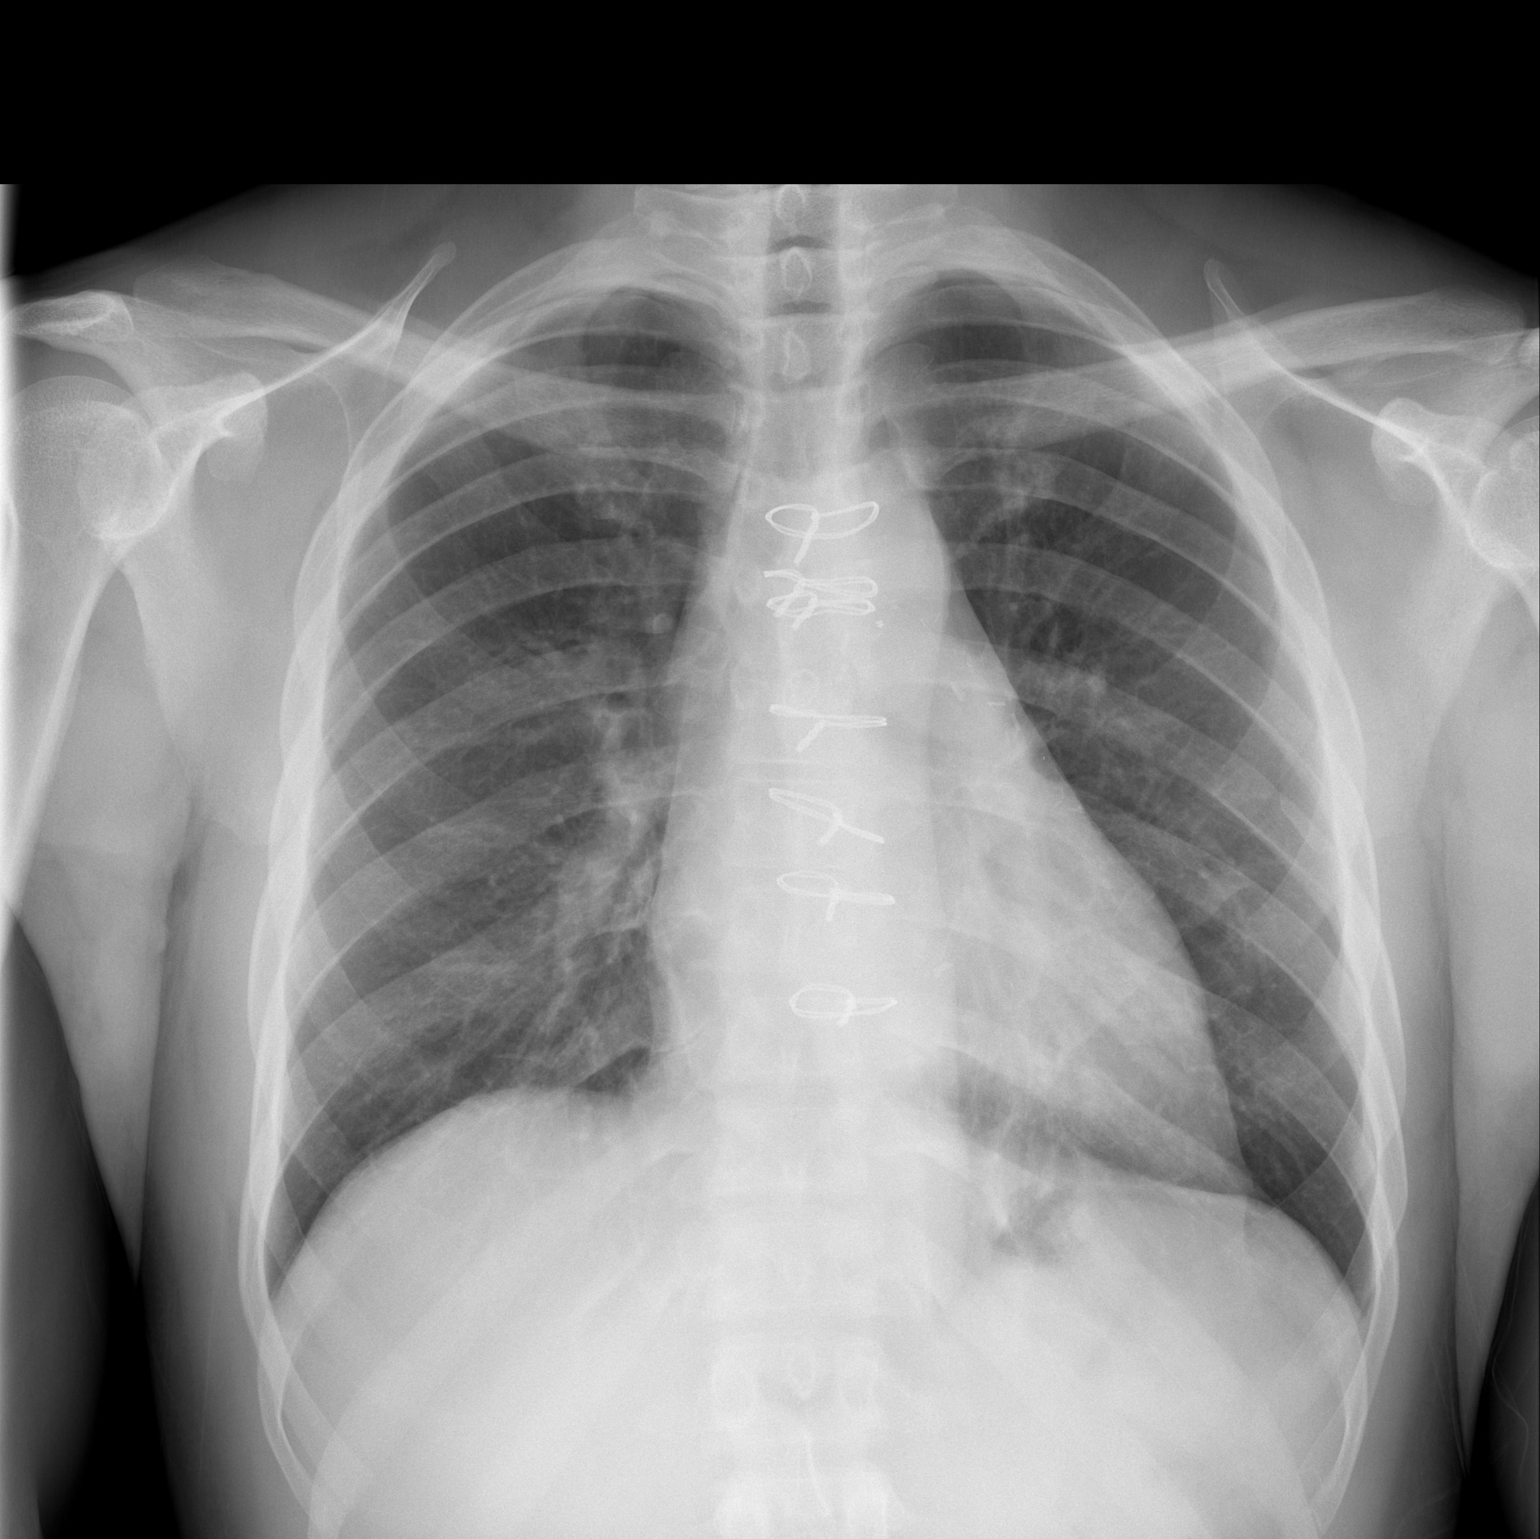

[w chest lat]
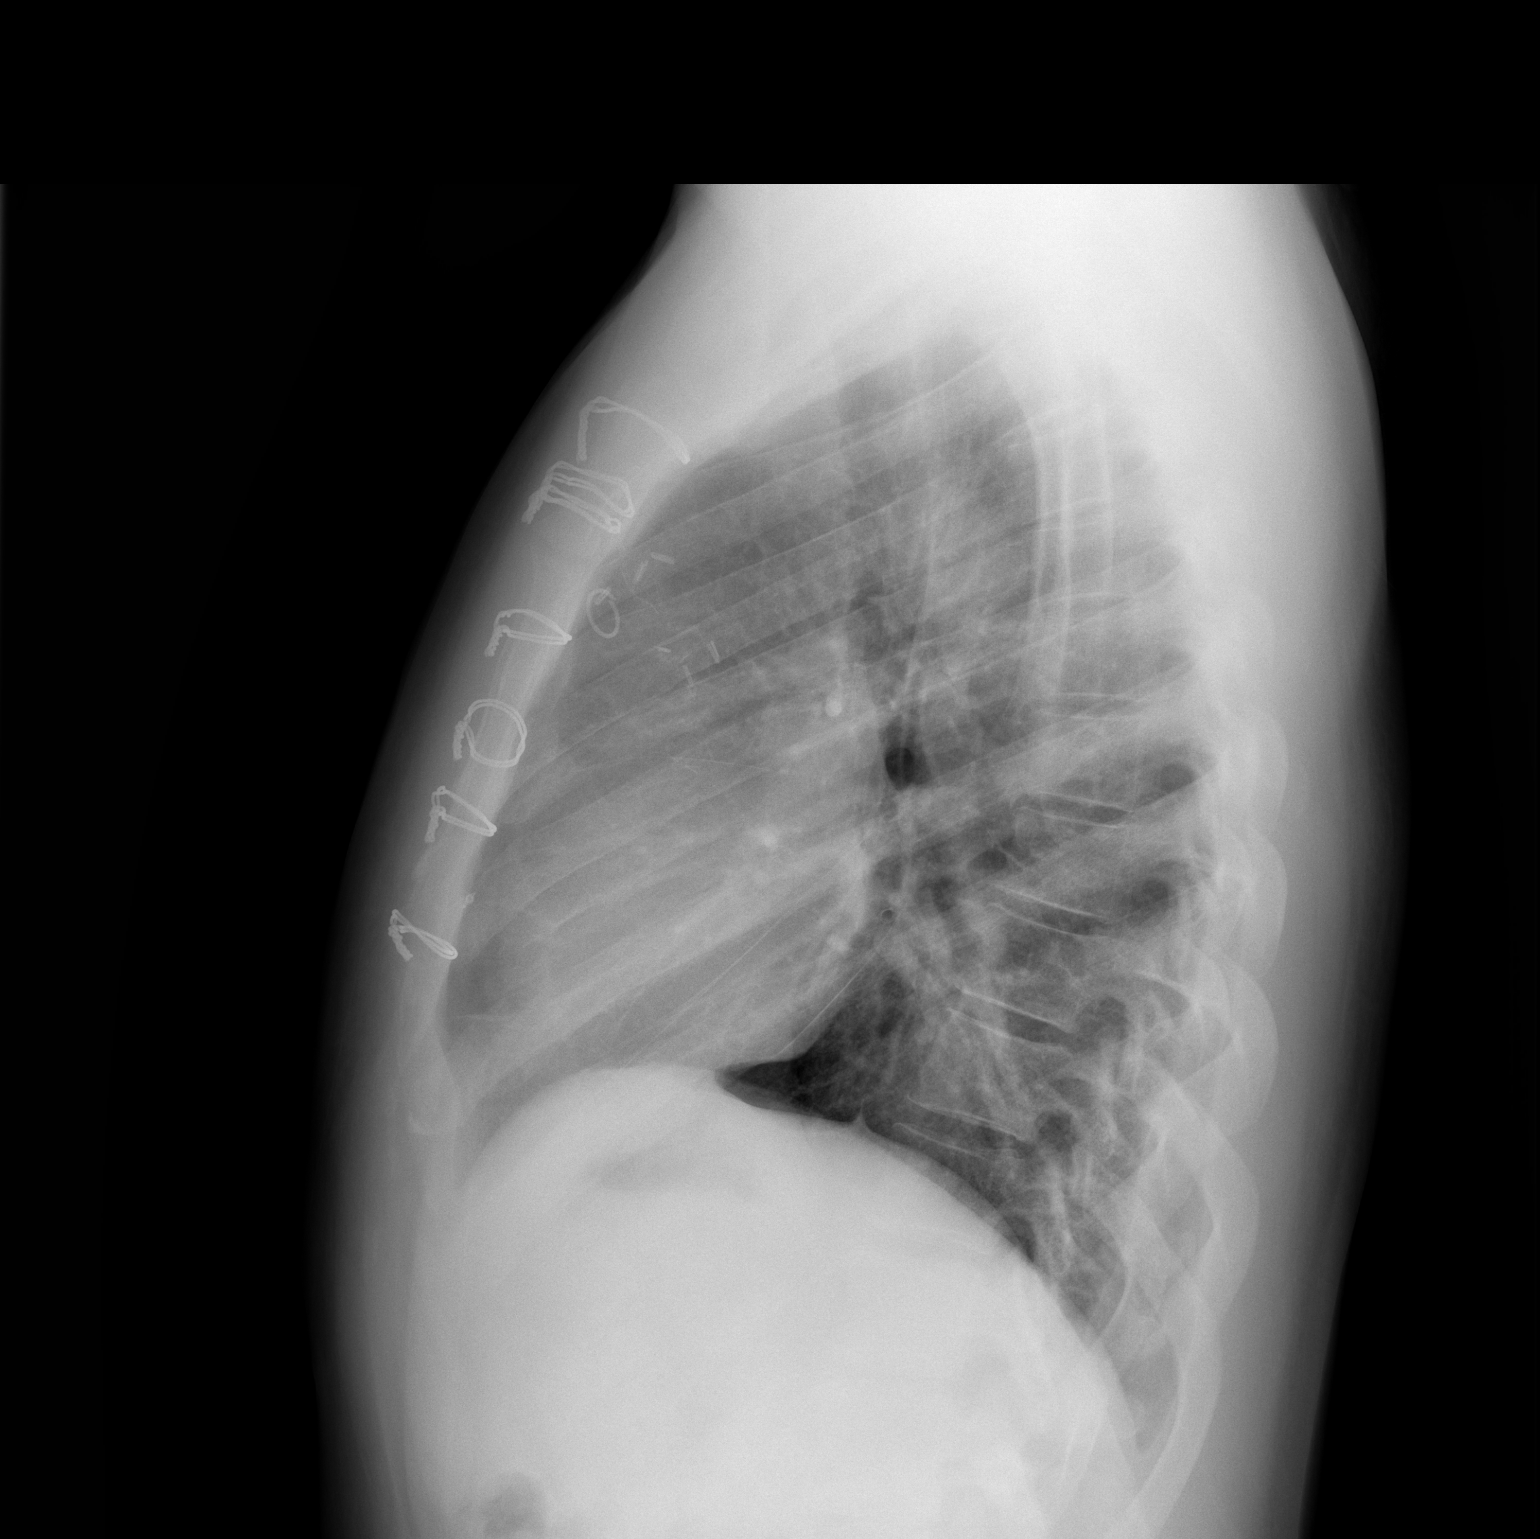

[2 of 2 positions shown; findings below may reference images not displayed]

FINDINGS: Normal heart size and mediastinal contours. CABG. No acute
infiltrate or edema. No effusion or pneumothorax. No acute osseous
findings.
IMPRESSION: Resolved postoperative changes.  No evidence of active disease.

## 2021-10-16 ENCOUNTER — Other Ambulatory Visit: Payer: Self-pay | Admitting: Internal Medicine

## 2021-11-08 ENCOUNTER — Other Ambulatory Visit: Payer: Self-pay | Admitting: Internal Medicine

## 2021-11-21 NOTE — Progress Notes (Unsigned)
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   Anxiety    HTN (hypertension)    Pericarditis 05/09/2020   Past Surgical History:  Procedure Laterality Date   CORONARY ARTERY BYPASS GRAFT N/A 07/06/2020   Procedure: CORONARY ARTERY BYPASS GRAFTING (CABG) TIMES TWO USING LEFT INTERNAL MAMMARY ARTERY AND RIGHT GREATER SAPHENOUS VEIN HARVESTED ENDOSCOPICALLY, CORONARY UNROOFING WITH RESUSPENSION OF THE AORTIC VALVE;  Surgeon: Corliss SkainsLightfoot, Harrell O, MD;  Location: [facility] OR;  Service: Open Heart Surgery;  Laterality: N/A;   KNEE SURGERY Right 1992   LAPAROSCOPIC APPENDECTOMY N/A 01/28/2015   Procedure: APPENDECTOMY LAPAROSCOPIC;  Surgeon: Claud KelpHaywood Ingram, MD;  Location: [facility];  Service: General;  Laterality: N/A;   TEE WITHOUT CARDIOVERSION N/A 07/06/2020   Procedure: TRANSESOPHAGEAL ECHOCARDIOGRAM (TEE);  Surgeon: Corliss SkainsLightfoot, Harrell O, MD;  [facility];  Service: Open Heart Surgery;  Laterality: N/A;    Allergies  No Known Allergies  History of Present Illness    Kevin Combs comes today for ongoing assessment and management of anomalous left main coronary artery status post CABG x2 with LIMA and right greater saphenous vein with coronary unroofing with resuspension of the aortic valve completed on 07/06/2020, and hypertension.   Last seen by Dr.Acharya on 03/30/2021.  He comes today without any cardiac complaints.  He states lowering the dose of the metoprolol has helped his headaches.  He likes to hike every meal and has no complaints of chest pain shortness of breath fatigue or dizziness while doing so.  He is medically compliant.  He offers no new symptoms and is due to see his PCP for labs.  Home Medications    Current Outpatient Medications  Medication Sig Dispense Refill   aspirin EC 81 MG tablet Take 1 tablet (81 mg total) by mouth daily. Swallow whole. 90 tablet 3   atorvastatin (LIPITOR) 80 MG tablet Take 1 tablet (80 mg total) by mouth daily. 90 tablet 3   metoprolol succinate (TOPROL-XL) 25 MG 24 hr tablet Take 1 tablet (25 mg total) by mouth daily. Take with or immediately following a meal. 90 tablet 3   No current facility-administered medications for this visit.     Family History    Family History  Problem Relation Age of Onset   Hypertension Mother    Crohn's disease Daughter    He indicated that his mother is alive. He indicated that his father is alive. He indicated that his sister is alive. He indicated that his brother is alive. He indicated that his maternal grandmother is deceased. He indicated that his maternal grandfather is deceased. He indicated that his paternal grandmother is deceased. He indicated that his paternal grandfather is deceased. He indicated that his daughter is alive. He indicated that his son is alive.  Social History    Social History   Socioeconomic History  Marital status: Single    Spouse name: Not on file   Number of children: Not on file   Years of education: Not on file   Highest education level: Not on file  Occupational History   Not on file  Tobacco Use   Smoking status: Former    Types: Cigars   Smokeless tobacco: Never  Vaping Use   Vaping Use: Never used  Substance and Sexual Activity   Alcohol use: Yes    Alcohol/week: 14.0 standard drinks  of alcohol    Types: 14 Standard drinks or equivalent per week    Comment: vodka   Drug use: Yes    Frequency: 14.0 times per week    Types: Marijuana   Sexual activity: Not on file  Other Topics Concern   Not on file  Social History Narrative   Not on file   Social Determinants of Health   Financial Resource Strain: Not on file  Food Insecurity: Not on file  Transportation Needs: Not on file  Physical Activity: Not on file  Stress: Not on file  Social Connections: Not on file  Intimate Partner Violence: Not on file     Review of Systems    General:  No chills, fever, night sweats or weight changes.  Cardiovascular:  No chest pain, dyspnea on exertion, edema, orthopnea, palpitations, paroxysmal nocturnal dyspnea. Dermatological: No rash, lesions/masses Respiratory: No cough, dyspnea Urologic: No hematuria, dysuria Abdominal:   No nausea, vomiting, diarrhea, bright red blood per rectum, melena, or hematemesis Neurologic:  No visual changes, wkns, changes in mental status. All other systems reviewed and are otherwise negative except as noted above.     Physical Exam    VS:  BP (!) 158/96   Pulse 67   Ht 5\' 10"  (1.778 m)   Wt 228 lb (103.4 kg)   SpO2 97%   BMI 32.71 kg/m  , BMI Body mass index is 32.71 kg/m.     GEN: Well nourished, well developed, in no acute distress. HEENT: normal. Neck: Supple, no JVD, carotid bruits, or masses. Cardiac: RRR, 2/6 systolic murmurs, heard best at the left sternal border, no rubs, or gallops. No clubbing, cyanosis, edema.  Radials/DP/PT 2+ and equal bilaterally.  Respiratory:  Respirations regular and unlabored, clear to auscultation bilaterally. GI: Soft, nontender, nondistended, BS + x 4. MS: no deformity or atrophy. Skin: warm and dry, no rash. Neuro:  Strength and sensation are intact. Psych: Normal affect.  Accessory Clinical Findings    ECG personally reviewed by me today-normal sinus rhythm, ventricular rate 57 bpm.-  No acute changes  Lab Results  Component Value Date   WBC 7.4 07/09/2020   HGB 9.8 (L) 07/09/2020   HCT 28.7 (L) 07/09/2020   MCV 86.2 07/09/2020   PLT 121 (L) 07/09/2020   Lab Results  Component Value Date   CREATININE 1.10 07/09/2020   BUN 12 07/09/2020   NA 135 07/09/2020   K 3.8 07/09/2020   CL 101 07/09/2020   CO2 24 07/09/2020   Lab Results  Component Value Date   ALT 31 07/04/2020   AST 25 07/04/2020   ALKPHOS 43 07/04/2020   BILITOT 1.3 (H) 07/04/2020   Lab Results  Component Value Date   CHOL 191 05/10/2020   HDL 76 05/10/2020   LDLCALC 105 (H) 05/10/2020   TRIG 52 05/10/2020   CHOLHDL 2.5 05/10/2020    Lab Results  Component Value Date   HGBA1C 5.5 07/04/2020    Review of  Prior Studies: Echo Intraoperative TEE 08-02-2020 POST-OP IMPRESSIONS  Overall, there were no significant changes from pre-bypass.  - Left Ventricle: The left ventricle is unchanged from pre-bypass.  - Right Ventricle: The right ventricle appears unchanged from pre-bypass.  - Aorta: The aorta appears unchanged from pre-bypass.  - Left Atrium: The left atrium appears unchanged from pre-bypass.  - Left Atrial Appendage: The left atrial appendage appears unchanged from pre-bypass.  - Aortic Valve: The aortic valve appears unchanged from pre-bypass.  - Mitral Valve: The mitral valve appears unchanged from pre-bypass.  - Tricuspid Valve: The tricuspid valve appears unchanged from pre-bypass.  - Interatrial Septum: The interatrial septum appears unchanged from  pre-bypass.  - Interventricular Septum: The interventricular septum appears unchanged  from pre-bypass.  - Pericardium: The pericardium appears unchanged from pre-bypass.    Myoview 05/11/20 Blood pressure demonstrated a normal response to exercise. The study is normal. This is a low risk study. The left ventricular ejection fraction is mildly decreased (45-54%). Normal resting and stress perfusion. No ischemia or infarction  EF Estimated 46% but no RWMA and normal by TTE this admission    CT Coronary Fractional Flow 05/10/20 FINDINGS: FFRct analysis was performed on the original cardiac CT angiogram dataset. Diagrammatic representation of the FFRct analysis is provided in a separate PDF document in PACS. This dictation was created using the PDF document and an interactive 3D model of the results. 3D model is not available in the EMR/PACS. Normal FFR range is >0.80. Indeterminate (grey) zone is 0.76-0.80. 1. Left Main: FFR = 0.96 2. LAD: Proximal FFR = 0.94, mid FFR = 0.87, Distal FFR = 0.78 3. Ramus intermedius = proximal 0.96, distal 0.94 4. LCX: Proximal FFR = 0.94, distal FFR = 0.92 5. RCA: Proximal FFR = 0.96, mid FFR =0.93, distal FFR = 0.91  IMPRESSION: 1. CT FFR analysis showed no significant stenosis. No ischemia noted at rest with anomalous left main coronary artery with interarterial course.   CTA coronary 05/10/20 1. 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 Ostium has slit-like appearance, and there is mild systolic compression of the body of the left main coronary artery. Right dominant circulation. CT FFR will be performed and reported separately for ischemic evaluation. 2.  No evidence of CAD, CADRADS = 0. 3. Coronary calcium score of 0.  Echo 05/09/20 1. Left ventricular ejection fraction, by estimation, is 55 to 60%. The  left ventricle has normal function. The left ventricle has no regional  wall motion abnormalities. There is mild left ventricular hypertrophy.  Left ventricular diastolic parameters  are consistent with Grade I diastolic dysfunction (impaired relaxation).   2. Right ventricular systolic function is normal. The right ventricular  size is normal. Tricuspid regurgitation signal is inadequate for assessing  PA pressure.   3. The mitral valve is abnormal. Trivial mitral valve  regurgitation.   4. The aortic valve is tricuspid. Aortic valve regurgitation is not  visualized.   5. The inferior vena cava is normal in size with greater than 50%  respiratory variability, suggesting right atrial pressure of 3 mmHg  Assessment & Plan   1.  Anomalous left main: Surgical repair by Dr. Cliffton Asters with LIMA to right greater saphenous vein with coronary unroofing and resuspension of the aortic valve on 2020-08-02.  He is doing well, not having any cardiac complaints remains active.  No changes in his medications.  2.  Hypertension: Blood pressure is elevated  today as he had an argument with his teenage daughter before coming in.  We will continue him on metoprolol 25 mg daily.  Consider adding HCTZ 12.5 mg daily if blood pressure remains greater then 135/70 on follow-up visits.  3.  Hypercholesterolemia: Remains on atorvastatin 80 mg daily.  He is having follow-up labs completed by PCP in the next month.  Goal of LDL less than 70.    Current medicines are reviewed at length with the patient today.  I have spent 25 min's  dedicated to the care of this patient on the date of this encounter to include pre-visit review of records, assessment, management and diagnostic testing,with shared decision making.  Signed, Bettey Mare. Liborio Nixon, ANP, AACC   11/22/2021 2:01 PM    [facility] [address] Office [phone] Fax [phone]  Notice: This dictation was prepared with Dragon dictation along with smaller phrase technology. Any transcriptional errors that result from this process are unintentional and may not be corrected upon review.

## 2021-11-22 ENCOUNTER — Encounter: Payer: Self-pay | Admitting: Adult Health

## 2021-11-22 ENCOUNTER — Ambulatory Visit: Payer: 59 | Admitting: Adult Health

## 2021-11-22 VITALS — BP 158/96 | HR 67 | Ht 70.0 in | Wt 228.0 lb

## 2021-11-22 DIAGNOSIS — Q245 Malformation of coronary vessels: Secondary | ICD-10-CM

## 2021-11-22 DIAGNOSIS — I10 Essential (primary) hypertension: Secondary | ICD-10-CM

## 2021-11-22 DIAGNOSIS — E7800 Pure hypercholesterolemia, unspecified: Secondary | ICD-10-CM

## 2021-11-22 MED ORDER — ATORVASTATIN CALCIUM 80 MG PO TABS: 80.0000 mg | ORAL_TABLET | Freq: Every day | ORAL | 3 refills | Status: DC

## 2021-11-22 MED ORDER — METOPROLOL SUCCINATE ER 25 MG PO TB24
25.0000 mg | ORAL_TABLET | Freq: Every day | ORAL | 3 refills | Status: DC
Start: 2021-11-22 — End: 2022-12-10

## 2021-11-22 NOTE — Patient Instructions (Signed)
Medication Instructions:  Your physician recommends that you continue on your current medications as directed. Please refer to the Current Medication list given to you today.   *If you need a refill on your cardiac medications before your next appointment, please call your pharmacy*   Lab Work: NONE ordered at this time of appointment   If you have labs (blood work) drawn today and your tests are completely normal, you will receive your results only by: MyChart Message (if you have MyChart) OR A paper copy in the mail If you have any lab test that is abnormal or we need to change your treatment, we will call you to review the results.   Testing/Procedures: NONE ordered at this time of appointment    Follow-Up: At [facility], you and your health needs are our priority.  As part of our continuing mission to provide you with exceptional heart care, we have created designated Provider Care Teams.  These Care Teams include your primary Cardiologist (physician) and Advanced Practice Providers (APPs -  Physician Assistants and Nurse Practitioners) who all work together to provide you with the care you need, when you need it.  We recommend signing up for the patient portal called "MyChart".  Sign up information is provided on this After Visit Summary.  MyChart is used to connect with patients for Virtual Visits (Telemedicine).  Patients are able to view lab/test results, encounter notes, upcoming appointments, etc.  Non-urgent messages can be sent to your provider as well.   To learn more about what you can do with MyChart, go to [email or web address].    Your next appointment:   6 month(s)  The format for your next appointment:   In Person  Provider:   Gayatri A Acharya, MD     Other Instructions   Important Information About Sugar       

## 2022-04-29 ENCOUNTER — Other Ambulatory Visit: Payer: Self-pay | Admitting: Internal Medicine

## 2022-12-10 ENCOUNTER — Other Ambulatory Visit: Payer: Self-pay | Admitting: Adult Health

## 2022-12-14 ENCOUNTER — Other Ambulatory Visit: Payer: Self-pay | Admitting: Adult Health

## 2022-12-17 ENCOUNTER — Other Ambulatory Visit: Payer: Self-pay | Admitting: Adult Health

## 2022-12-31 NOTE — Progress Notes (Unsigned)
Cardiology Clinic Note   Date: 01/02/2023 ID: OSHA KEMPNER, DOB 03/22/1976, MRN [id number]  Primary Cardiologist:  Parke Poisson, MD  Patient Profile    Kevin Combs is a 47 y.o. male who presents to the clinic today for routine follow up.     Past medical history significant for: Anomalous coronary artery. Coronary CTA 05/10/2020: Right dominance.  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  It takes an interarterial course between the aorta and pulmonary artery.  Ostium is slit like appearance, in the body of the left main coronary artery may be partially intramural.  There is mild systolic compression of the left main coronary artery.  The left main then trifurcates into the LAD, ramus intermedius and left circumflex arteries.  The RCA has a normal origin and course and is dominant.  Calcium score 0.  Ischemia noted at rest with anomalous left main artery with interarterial course. Exercise stress test 05/11/2020: Normal, low risk study.  No ischemia or infarction. CABG x 2 07/06/2020: LIMA to LAD, reverse SVG to OM1, coronary unroofing with resuspension of the aortic valve. Pericarditis. Echo 05/09/2020: EF 55 to 60%.  No RWMA.  Mild LVH.  Grade I DD.  Normal RV function.  Trivial MR.  There is a right bowing of the interatrial septum suggestive of elevated LA pressure. Hypertension. Hyperlipidemia. Polysubstance abuse. In remission.      History of Present Illness    Kevin Combs was first evaluated by cardiology on 05/09/2020 for chest pain during hospital admission.  Patient presented to urgent care on 05/09/2020 with complaints of left arm pain.  EKG showed diffuse ST elevations, code STEMI was called and patient was transported to [facility], ED.  Dr. Excell Seltzer initially saw patient in the ED and found patient to be essentially pain-free and EKG showed diffuse ST elevation and PR depression concerning for pericarditis.  Code  STEMI was canceled and workup was continued in the ED.  Troponin was mildly elevated but downtrending.  Chest x-ray showed no acute findings.  Respiratory panel was negative.  Patient was started on treatment for pericarditis.  Given elevation of troponin and abnormalities on EKG with possible cardiac chest pain, coronary CTA was performed demonstrating no CAD or coronary calcifications but an anomalous origin of the left main coronary artery of the right coronary cusp with interarterial and possible intramural course.  FFR performed which showed no flow limitation.  Exercise stress test showed no ischemic EKG changes and no perfusion abnormalities.  He was determined to be an overall low risk and surgical consultation was performed as an outpatient.  Patient underwent CABG x 2 and unroofing of left main coronary artery in February 2022.  Patient continues to be followed by Dr. Jacques Navy for the above outlined history.  Patient was last seen in the office by Joni Reining, NP on 11/22/2021 for routine follow-up.  He was doing well at that time and no medication changes were made.  Today, patient is doing well. Patient denies shortness of breath or dyspnea on exertion. No chest pain, pressure, or tightness. Denies lower extremity edema, orthopnea, or PND. No palpitations. 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 He stays active walking 3 miles 3 times a week.    ROS: All other systems reviewed and are otherwise negative except as noted in History of Present Illness.  Studies Reviewed  EKG Interpretation Date/Time:  Wednesday January 02 2023 08:08:14 EDT Ventricular Rate:  51 PR Interval:  154 QRS Duration:  104 QT Interval:  428 QTC Calculation: 394 R Axis:   55  Text Interpretation: Sinus bradycardia Minimal voltage criteria for LVH, may be normal variant ( Sokolow-Lyon ) ST & T wave abnormality, consider anterior ischemia When compared with ECG of 07-Jul-2020  07:07, Vent. rate has decreased BY  31 BPM ST no longer elevated in Anterior leads T wave inversion now evident in Anterior leads Nonspecific T wave abnormality no longer evident in Lateral leads Confirmed by Carlos Levering [phone] on 01/02/2023 8:13:38 AM   Physical Exam    VS:  BP 138/88 (BP Location: Left Arm, Patient Position: Sitting, Cuff Size: Normal)   Pulse (!) 51   Ht 5\' 11"  (1.803 m)   Wt 224 lb 3.2 oz (101.7 kg)   SpO2 97%   BMI 31.27 kg/m  , BMI Body mass index is 31.27 kg/m.  GEN: Well nourished, well developed, in no acute distress. Neck: No JVD or carotid bruits. Cardiac:  RRR. No murmurs. No rubs or gallops.   Respiratory:  Respirations regular and unlabored. Clear to auscultation without rales, wheezing or rhonchi. GI: Soft, nontender, nondistended. Extremities: Radials/DP/PT 2+ and equal bilaterally. No clubbing or cyanosis. No edema.  Skin: Warm and dry, no rash. Neuro: Strength intact.  Assessment & Plan    Anomalous coronary artery.  S/p CABG x 2 with coronary unroofing and resuspension of the aortic valve February 2022. Patient denies chest pain, tightness, pressure.  Continue aspirin, atorvastatin, metoprolol. Hypertension: BP today 138/88. Patient denies headaches, dizziness or vision changes. BP typically elevated in the doctors office. Normally ~130 at home. Continue Toprol. Hyperlipidemia. No lipids since 2021. He is fasting today. Will get lipid panel with CMP and CBC.   Disposition: Lipid panel, CMP, CBC. Return in 1 year or sooner as needed.          Signed, Etta Grandchild. Jahara Dail, DNP, NP-C

## 2023-01-02 ENCOUNTER — Encounter: Payer: Self-pay | Admitting: Student

## 2023-01-02 ENCOUNTER — Ambulatory Visit: Payer: 59 | Attending: Student | Admitting: Student

## 2023-01-02 VITALS — BP 138/88 | HR 51 | Ht 71.0 in | Wt 224.2 lb

## 2023-01-02 DIAGNOSIS — I10 Essential (primary) hypertension: Secondary | ICD-10-CM

## 2023-01-02 DIAGNOSIS — E785 Hyperlipidemia, unspecified: Secondary | ICD-10-CM | POA: Diagnosis not present

## 2023-01-02 DIAGNOSIS — Q245 Malformation of coronary vessels: Secondary | ICD-10-CM | POA: Diagnosis not present

## 2023-01-02 LAB — CBC

## 2023-01-02 MED ORDER — ATORVASTATIN CALCIUM 80 MG PO TABS: 80.0000 mg | ORAL_TABLET | Freq: Every day | ORAL | 1 refills | Status: DC

## 2023-01-02 MED ORDER — METOPROLOL SUCCINATE ER 25 MG PO TB24: 25.0000 mg | ORAL_TABLET | Freq: Every day | ORAL | 1 refills | Status: DC

## 2023-01-02 NOTE — Patient Instructions (Addendum)
Medication Instructions:  Your refills have been sent to your pharmacy *If you need a refill on your cardiac medications before your next appointment, please call your pharmacy*   Lab Work: CMET, CBC, LIPID If you have labs (blood work) drawn today and your tests are completely normal, you will receive your results only by: MyChart Message (if you have MyChart) OR A paper copy in the mail If you have any lab test that is abnormal or we need to change your treatment, we will call you to review the results.   Testing/Procedures: NONE   Follow-Up: At [facility], you and your health needs are our priority.  As part of our continuing mission to provide you with exceptional heart care, we have created designated Provider Care Teams.  These Care Teams include your primary Cardiologist (physician) and Advanced Practice Providers (APPs -  Physician Assistants and Nurse Practitioners) who all work together to provide you with the care you need, when you need it.  We recommend signing up for the patient portal called "MyChart".  Sign up information is provided on this After Visit Summary.  MyChart is used to connect with patients for Virtual Visits (Telemedicine).  Patients are able to view lab/test results, encounter notes, upcoming appointments, etc.  Non-urgent messages can be sent to your provider as well.   To learn more about what you can do with MyChart, go to [email or web address].    Your next appointment:   1 year(s)  Provider:   Parke Poisson, MD

## 2023-01-03 LAB — COMPREHENSIVE METABOLIC PANEL
ALT: 30 IU/L (ref 0–44)
AST: 29 IU/L (ref 0–40)
Albumin: 4.6 g/dL (ref 4.1–5.1)
Alkaline Phosphatase: 58 IU/L (ref 44–121)
BUN/Creatinine Ratio: 10 (ref 9–20)
BUN: 11 mg/dL (ref 6–24)
Bilirubin Total: 1.4 mg/dL — ABNORMAL HIGH (ref 0.0–1.2)
CO2: 27 mmol/L (ref 20–29)
Calcium: 9.6 mg/dL (ref 8.7–10.2)
Chloride: 103 mmol/L (ref 96–106)
Creatinine, Ser: 1.07 mg/dL (ref 0.76–1.27)
Globulin, Total: 2.4 g/dL (ref 1.5–4.5)
Glucose: 104 mg/dL — ABNORMAL HIGH (ref 70–99)
Potassium: 4.3 mmol/L (ref 3.5–5.2)
Sodium: 140 mmol/L (ref 134–144)
Total Protein: 7 g/dL (ref 6.0–8.5)
eGFR: 86 mL/min/{1.73_m2} (ref >59)

## 2023-01-03 LAB — CBC
Hematocrit: 42 % (ref 37.5–51.0)
Hemoglobin: 14.2 g/dL (ref 13.0–17.7)
MCH: 28.9 pg (ref 26.6–33.0)
MCHC: 33.8 g/dL (ref 31.5–35.7)
MCV: 85 fL (ref 79–97)
Platelets: 200 10*3/uL (ref 150–450)
RBC: 4.92 x10E6/uL (ref 4.14–5.80)
RDW: 13.8 % (ref 11.6–15.4)
WBC: 3.7 10*3/uL (ref 3.4–10.8)

## 2023-01-03 LAB — LIPID PANEL
Chol/HDL Ratio: 2 ratio (ref 0.0–5.0)
Cholesterol, Total: 121 mg/dL (ref 100–199)
HDL: 62 mg/dL (ref >39)
LDL Chol Calc (NIH): 45 mg/dL (ref 0–99)
Triglycerides: 64 mg/dL (ref 0–149)
VLDL Cholesterol Cal: 14 mg/dL (ref 5–40)

## 2023-07-25 ENCOUNTER — Other Ambulatory Visit: Payer: Self-pay | Admitting: Student

## 2023-08-22 ENCOUNTER — Other Ambulatory Visit: Payer: Self-pay | Admitting: Student

## 2023-11-05 ENCOUNTER — Other Ambulatory Visit: Payer: Self-pay

## 2023-11-05 MED ORDER — METOPROLOL SUCCINATE ER 25 MG PO TB24: 25.0000 mg | ORAL_TABLET | Freq: Every day | ORAL | 0 refills | Status: DC

## 2024-02-14 ENCOUNTER — Other Ambulatory Visit: Payer: Self-pay | Admitting: Internal Medicine

## 2024-02-17 ENCOUNTER — Other Ambulatory Visit: Payer: Self-pay | Admitting: Internal Medicine

## 2024-03-04 ENCOUNTER — Telehealth: Payer: Self-pay

## 2024-03-04 NOTE — Telephone Encounter (Signed)
 Copied from CRM [id number]. Topic: Appointments - Scheduling Inquiry for Clinic >> Mar 04, 2024 12:25 PM [place] E wrote: Reason for CRM: Patient has seen Dr. Frann in the past and his wife also goes to him for care, and patient was wondering if that PCP would be willing to take him back on as a patient. Please call patient and advise if Dr. Frann is able to do so.

## 2024-03-04 NOTE — Telephone Encounter (Signed)
 Okay

## 2024-03-06 NOTE — Telephone Encounter (Signed)
 Lvm for pt to schedule

## 2024-03-17 ENCOUNTER — Encounter: Payer: Self-pay | Admitting: Family Medicine

## 2024-03-17 ENCOUNTER — Ambulatory Visit: Admitting: Family Medicine

## 2024-03-17 VITALS — BP 148/94 | HR 58 | Resp 18 | Ht 71.0 in | Wt 233.0 lb

## 2024-03-17 DIAGNOSIS — L729 Follicular cyst of the skin and subcutaneous tissue, unspecified: Secondary | ICD-10-CM | POA: Diagnosis not present

## 2024-03-17 DIAGNOSIS — G8929 Other chronic pain: Secondary | ICD-10-CM

## 2024-03-17 DIAGNOSIS — M25512 Pain in left shoulder: Secondary | ICD-10-CM

## 2024-03-17 DIAGNOSIS — I10 Essential (primary) hypertension: Secondary | ICD-10-CM | POA: Diagnosis not present

## 2024-03-17 DIAGNOSIS — Z1211 Encounter for screening for malignant neoplasm of colon: Secondary | ICD-10-CM | POA: Diagnosis not present

## 2024-03-17 MED ORDER — METOPROLOL SUCCINATE ER 50 MG PO TB24: 50.0000 mg | ORAL_TABLET | Freq: Every day | ORAL | 1 refills | Status: DC

## 2024-03-17 NOTE — Progress Notes (Signed)
 Musculoskeletal Exam  Patient: Kevin Combs DOB: [birth year]  DOS: 03/17/2024  SUBJECTIVE:  Chief Complaint:   Chief Complaint  Patient presents with   Establish Care    Kevin Combs is a 48 y.o.  male for evaluation and treatment of L shoulder pain.   Onset:  1 yr ago. No inj or change in activity.  Location: top of shoulder Character:  sharp  Progression of issue:  is unchanged Associated symptoms: hurts when he laterally abducts L shoulder No bruising, decreased ROM, redness, swelling, catching/locking Treatment: to date has been acetaminophen .   Neurovascular symptoms: no  Skin lesion 2 years just behind L shoulder. No pain, redness, drainage, itching. Denies trauma. Might be getting a little bigger.  Hypertension Patient presents for hypertension follow up. He does monitor home blood pressures. Blood pressures ranging on average from 130's/80's. He is compliant with medication- Toprol  XL 25 mg/d. Patient has these side effects of medication: none He is not always adhering to a healthy diet overall. 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 No acute distress. Heart: RRR, no lower extremity edema, no bruits Thorax & Lungs: CTAB. No accessory muscle use Musculoskeletal: L shoulder.   Normal active range of motion: yes.   Normal passive range of motion: yes Tenderness to palpation: no Deformity: no Ecchymosis: no Tests positive: Empty can Tests negative: Neer's, Hawkins, crossover, liftoff, speeds, O'Brien's Neurologic: Normal sensory function. No focal  deficits noted. DTR's equal and symmetric in LE's. No clonus. Skin: Raised spherical lesion over the posterior left shoulder that has no erythema, excessive warmth, fluctuance, drainage, TTP.  There is a black patch with central opening centrally of this lesion. Psychiatric: Normal mood. Age appropriate judgment and insight. Alert & oriented x 3.    Assessment:  Essential hypertension - Plan: metoprolol  succinate (TOPROL -XL) 50 MG 24 hr tablet  Chronic left shoulder pain  Cyst of skin  Screen for colon cancer - Plan: Ambulatory referral to Gastroenterology  Plan: Chronic, not controlled.  Increase metoprolol  from 25 mg daily to 50 mg daily.  Counseled on diet and exercise.  Monitor blood pressure at home.  He has a follow-up appointment with his cardiologist next month.   Suspect mild supraspinatus involvement.  Stretches/exercises, heat, ice, Tylenol .  Refer to sports med no improvement. Reassurance. Refer to GI. Flu shot and tetanus shot politely declined. F/u in 6 months for physical. The patient voiced understanding and agreement to the plan.   Mabel [place], DO 03/17/24  11:00 AM

## 2024-03-17 NOTE — Patient Instructions (Addendum)
 Check your blood pressures 2-3 times per week, alternating the time of day you check it. If it is high, considering waiting 1-2 minutes and rechecking. If it gets higher, your anxiety is likely creeping up and we should avoid rechecking.   Heat (pad or rice pillow in microwave) over affected area, 10-15 minutes twice daily.   Ice/cold pack over area for 10-15 min twice daily.  If you do not hear anything about your referral in the next 1-2 weeks, call our office and ask for an update.  OK to take Tylenol  1000 mg (2 extra strength tabs) or 975 mg (3 regular strength tabs) every 6 hours as needed.  Please consider counseling. Contact [phone] to schedule an appointment or inquire about cost/insurance coverage.  [facility] located at [address], [address], [place], [place].  Phone number = [phone].  Dr. Verdel Silk - Adult Psychiatry.    [facility] located at [address] [place], [place], [place]. Phone number = [phone].   [facility] located at [address], [place], [place].  Phone number = [phone].   [facility] located at [address] [place], [place], [place].  Phone number = [phone].  EXERCISES  RANGE OF MOTION (ROM) AND STRETCHING EXERCISES These exercises may help you when beginning to rehabilitate your injury. While completing these exercises, remember:  Restoring tissue flexibility helps normal motion to return to the joints. This allows healthier, less painful movement and activity. An effective stretch should be held for at least 30 seconds. A stretch should never be painful. You should only feel a gentle lengthening or release in the stretched tissue.  ROM - Pendulum Bend at the waist so that your right / left arm falls away from your body. Support yourself with your opposite hand on a solid surface, such as a table or a countertop. Your right / left arm should be perpendicular to the  ground. If it is not perpendicular, you need to lean over farther. Relax the muscles in your right / left arm and shoulder as much as possible. Gently sway your hips and trunk so they move your right / left arm without any use of your right / left shoulder muscles. Progress your movements so that your right / left arm moves side to side, then forward and backward, and finally, both clockwise and counterclockwise. Complete 10-15 repetitions in each direction. Many people use this exercise to relieve discomfort in their shoulder as well as to gain range of motion. Repeat 2 times. Complete this exercise 3 times per week.  STRETCH - Flexion, Standing Stand with good posture. With an underhand grip on your right / left hand and an overhand grip on the opposite hand, grasp a broomstick or cane so that your hands are a little more than shoulder-width apart. Keeping your right / left elbow straight and shoulder muscles relaxed, push the stick with your opposite hand to raise your right / left arm in front of your body and then overhead. Raise your arm until you feel a stretch in your right / left shoulder, but before you have increased shoulder pain. Try to avoid shrugging your right / left shoulder as your arm rises by keeping your shoulder blade tucked down and toward your mid-back spine. Hold 30 seconds. Slowly return to the starting position. Repeat 2 times. Complete this exercise 3 times per week.  STRETCH - Internal Rotation Place your right / left hand behind your back, palm-up. Throw a  towel or belt over your opposite shoulder. Grasp the towel/belt with your right / left hand. While keeping an upright posture, gently pull up on the towel/belt until you feel a stretch in the front of your right / left shoulder. Avoid shrugging your right / left shoulder as your arm rises by keeping your shoulder blade tucked down and toward your mid-back spine. Hold 30. Release the stretch by lowering your opposite  hand. Repeat 2 times. Complete this exercise 3 times per week.  STRETCH - External Rotation and Abduction Stagger your stance through a doorframe. It does not matter which foot is forward. As instructed by your physician, physical therapist or athletic trainer, place your hands: And forearms above your head and on the door frame. And forearms at head-height and on the door frame. At elbow-height and on the door frame. Keeping your head and chest upright and your stomach muscles tight to prevent over-extending your low-back, slowly shift your weight onto your front foot until you feel a stretch across your chest and/or in the front of your shoulders. Hold 30 seconds. Shift your weight to your back foot to release the stretch. Repeat 2 times. Complete this stretch 3 times per week.   STRENGTHENING EXERCISES  These exercises may help you when beginning to rehabilitate your injury. They may resolve your symptoms with or without further involvement from your physician, physical therapist or athletic trainer. While completing these exercises, remember:  Muscles can gain both the endurance and the strength needed for everyday activities through controlled exercises. Complete these exercises as instructed by your physician, physical therapist or athletic trainer. Progress the resistance and repetitions only as guided. You may experience muscle soreness or fatigue, but the pain or discomfort you are trying to eliminate should never worsen during these exercises. If this pain does worsen, stop and make certain you are following the directions exactly. If the pain is still present after adjustments, discontinue the exercise until you can discuss the trouble with your clinician. If advised by your physician, during your recovery, avoid activity or exercises which involve actions that place your right / left hand or elbow above your head or behind your back or head. These positions stress the tissues which are  trying to heal.  STRENGTH - Scapular Depression and Adduction With good posture, sit on a firm chair. Supported your arms in front of you with pillows, arm rests or a table top. Have your elbows in line with the sides of your body. Gently draw your shoulder blades down and toward your mid-back spine. Gradually increase the tension without tensing the muscles along the top of your shoulders and the back of your neck. Hold for 3 seconds. Slowly release the tension and relax your muscles completely before completing the next repetition. After you have practiced this exercise, remove the arm support and complete it in standing as well as sitting. Repeat 2 times. Complete this exercise 3 times per week.   STRENGTH - External Rotators Secure a rubber exercise band/tubing to a fixed object so that it is at the same height as your right / left elbow when you are standing or sitting on a firm surface. Stand or sit so that the secured exercise band/tubing is at your side that is not injured. Bend your elbow 90 degrees. Place a folded towel or small pillow under your right / left arm so that your elbow is a few inches away from your side. Keeping the tension on the exercise band/tubing, pull  it away from your body, as if pivoting on your elbow. Be sure to keep your body steady so that the movement is only coming from your shoulder rotating. Hold 3 seconds. Release the tension in a controlled manner as you return to the starting position. Repeat 2 times. Complete this exercise 3 times per week.   STRENGTH - Supraspinatus Stand or sit with good posture. Grasp a 2-3 lb weight or an exercise band/tubing so that your hand is thumbs-up, like when you shake hands. Slowly lift your right / left hand from your thigh into the air, traveling about 30 degrees from straight out at your side. Lift your hand to shoulder height or as far as you can without increasing any shoulder pain. Initially, many people do not lift  their hands above shoulder height. Avoid shrugging your right / left shoulder as your arm rises by keeping your shoulder blade tucked down and toward your mid-back spine. Hold for 3 seconds. Control the descent of your hand as you slowly return to your starting position. Repeat 2 times. Complete this exercise 3 times per week.   STRENGTH - Shoulder Extensors Secure a rubber exercise band/tubing so that it is at the height of your shoulders when you are either standing or sitting on a firm arm-less chair. With a thumbs-up grip, grasp an end of the band/tubing in each hand. Straighten your elbows and lift your hands straight in front of you at shoulder height. Step back away from the secured end of band/tubing until it becomes tense. Squeezing your shoulder blades together, pull your hands down to the sides of your thighs. Do not allow your hands to go behind you. Hold for 3 seconds. Slowly ease the tension on the band/tubing as you reverse the directions and return to the starting position. Repeat 2 times. Complete this exercise 3 times per week.   STRENGTH - Scapular Retractors Secure a rubber exercise band/tubing so that it is at the height of your shoulders when you are either standing or sitting on a firm arm-less chair. With a palm-down grip, grasp an end of the band/tubing in each hand. Straighten your elbows and lift your hands straight in front of you at shoulder height. Step back away from the secured end of band/tubing until it becomes tense. Squeezing your shoulder blades together, draw your elbows back as you bend them. Keep your upper arm lifted away from your body throughout the exercise. Hold 3 seconds. Slowly ease the tension on the band/tubing as you reverse the directions and return to the starting position. Repeat 2 times. Complete this exercise 3 times per week.  STRENGTH - Scapular Depressors Find a sturdy chair without wheels, such as a from a dining room table. Keeping your  feet on the floor, lift your bottom from the seat and lock your elbows. Keeping your elbows straight, allow gravity to pull your body weight down. Your shoulders will rise toward your ears. Raise your body against gravity by drawing your shoulder blades down your back, shortening the distance between your shoulders and ears. Although your feet should always maintain contact with the floor, your feet should progressively support less body weight as you get stronger. Hold 3 seconds. In a controlled and slow manner, lower your body weight to begin the next repetition. Repeat 2 times. Complete this exercise 3 times per week.    This information is not intended to replace advice given to you by your health care provider. Make sure you discuss any  questions you have with your health care provider.   Document Released: 03/21/2005 Document Revised: 05/28/2014 Document Reviewed: 08/19/2008 Elsevier Interactive Patient Education [employer]

## 2024-03-21 ENCOUNTER — Other Ambulatory Visit: Payer: Self-pay | Admitting: Internal Medicine

## 2024-03-26 ENCOUNTER — Other Ambulatory Visit: Payer: Self-pay | Admitting: Internal Medicine

## 2024-03-27 ENCOUNTER — Other Ambulatory Visit: Payer: Self-pay | Admitting: Internal Medicine

## 2024-03-27 MED ORDER — ATORVASTATIN CALCIUM 80 MG PO TABS: 80.0000 mg | ORAL_TABLET | Freq: Every day | ORAL | 0 refills | Status: DC

## 2024-03-30 ENCOUNTER — Encounter: Payer: Self-pay | Admitting: Family Medicine

## 2024-04-14 ENCOUNTER — Ambulatory Visit: Attending: Internal Medicine | Admitting: Internal Medicine

## 2024-04-14 ENCOUNTER — Encounter: Payer: Self-pay | Admitting: Internal Medicine

## 2024-04-14 VITALS — BP 167/96 | HR 54 | Ht 71.0 in | Wt 233.5 lb

## 2024-04-14 DIAGNOSIS — I10 Essential (primary) hypertension: Secondary | ICD-10-CM

## 2024-04-14 NOTE — Patient Instructions (Addendum)
 Medication Instructions:  No Changes *If you need a refill on your cardiac medications before your next appointment, please call your pharmacy*  Lab Work: None  Follow-Up: At [facility], you and your health needs are our priority.  As part of our continuing mission to provide you with exceptional heart care, our providers are all part of one team.  This team includes your primary Cardiologist (physician) and Advanced Practice Providers or APPs (Physician Assistants and Nurse Practitioners) who all work together to provide you with the care you need, when you need it.  Your next appointment:   6 month(s)  (Please schedule AFTER you see your primary care provider; Due around 10/13/2024)  Provider:   [name], MD  OR if no MD availability, can see Scot Ford, PA, or Aline Door, [place], or Damien Braver, NP, or Josefa Beauvais, NP  Other Instructions Please check your Blood Pressure at least two times per week. If you see that it is consistently higher than 140/90, please call or send in a MyChart message. Thank you. We recommend an Upper Arm Omron brand cuff; these cost around $37 at the [facility].

## 2024-04-14 NOTE — Progress Notes (Signed)
 Cardiology Office Note:  .   Date:  04/14/2024  ID:  Lynwood DELENA Pizza, DOB 1975-10-26, MRN [id number] PCP: Frann Mabel Mt, DO  [facility] Providers Cardiologist:  [name], MD    History of Present Illness: .   KAMDEN STANISLAW is a 48 y.o. male.  Discussed the use of AI scribe software for clinical note transcription with the patient, who gave verbal consent to proceed.  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  His metoprolol  was increased to 50 mg daily two weeks ago. His BP today is 167/96 mmHg, with usual readings around 148/94 mmHg. He attributes today's elevation to dietary indiscretion last night.  He has no chest pain or shortness of breath, including with exercise. He exercises three times weekly and walks about 10,000 steps daily. 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  He takes aspirin  81 mg daily, atorvastatin  80 mg daily, and metoprolol  50 mg daily. Since the metoprolol  increase he feels sluggish and lightheaded, especially at the start of workouts, though he can complete his routine.  He has a family history of high blood pressure. Family stressors contributes to stress and increased alcohol use. He stopped smoking [employer]s after his heart procedure in February 2022.    ROS: negative except per HPI above.  Studies Reviewed: [facility]   EKG Interpretation Date/Time:  Tuesday April 14 2024 09:14:14 EST Ventricular Rate:  54 PR Interval:  150 QRS Duration:  94 QT Interval:  420 QTC Calculation: 398 R Axis:   9  Text Interpretation: Sinus bradycardia Possible Left atrial enlargement T wave abnormality, consider anterior  ischemia When compared with ECG of 02-Jan-2023 08:08, No significant change was found Confirmed by Merck Soyla ([id number]) on 04/14/2024 9:43:08 AM    Results LABS LDL: 45 (2024)  DIAGNOSTIC EKG: Abnormal, unchanged from previous Risk Assessment/Calculations:       Physical Exam:   VS:  BP (!) 167/96 (BP Location: Left Arm, Patient Position: Sitting)   Pulse (!) 54   Ht 5' 11 (1.803 m)   Wt 233 lb 8 oz (105.9 kg)   SpO2 100%   BMI 32.57 kg/m    Wt Readings from Last 3 Encounters:  04/14/24 233 lb 8 oz (105.9 kg)  03/17/24 233 lb (105.7 kg)  01/02/23 224 lb 3.2 oz (101.7 kg)     Physical Exam VITALS: BP- 167/96 GENERAL: Alert, cooperative, well developed, no acute distress. HEENT: Normocephalic, normal oropharynx, moist mucous membranes. CHEST: Clear to auscultation bilaterally, no wheezes, rhonchi, or crackles. CARDIOVASCULAR: Normal heart rate and rhythm, S1 and S2 normal without murmurs. ABDOMEN: Soft, non-tender, non-distended, without organomegaly, normal bowel sounds. EXTREMITIES: No cyanosis or edema. NEUROLOGICAL: Cranial nerves grossly intact, moves all extremities without gross motor or sensory deficit.   ASSESSMENT AND PLAN: .    Assessment and Plan Assessment & Plan Essential hypertension Blood pressure elevated at 167/96 mmHg, likely due to lifestyle factors. Family history noted. Discussed potential need for additional medication if lifestyle changes are insufficient. Emphasized importance of blood pressure control to prevent cardiovascular complications. - Continue metoprolol  50 mg daily. - Encouraged lifestyle modifications including diet and alcohol reduction. - Monitor blood pressure regularly. -  Consider additional antihypertensive medication if lifestyle changes are insufficient.  History of coronary artery bypass grafting for anomalous left main coronary artery Status post CABG with LIMA to LAD and reverse saphenous vein graft to OM1. EKG shows  persistent abnormality, unchanged. Discussed importance of cardiovascular health maintenance. - Continue aspirin  81 mg daily. - Continue atorvastatin  80 mg daily. - Encouraged regular exercise and healthy lifestyle.  Hyperlipidemia Cholesterol levels well-controlled with LDL at 45 mg/dL. Discussed importance of maintaining cholesterol control. - Continue atorvastatin  80 mg daily. - Recheck cholesterol levels at next physical in April.        Soyla Merck, MD, FACC

## 2024-04-27 ENCOUNTER — Other Ambulatory Visit: Payer: Self-pay | Admitting: Internal Medicine

## 2024-05-04 ENCOUNTER — Encounter: Payer: Self-pay | Admitting: Internal Medicine

## 2024-05-04 MED ORDER — ATORVASTATIN CALCIUM 80 MG PO TABS: 80.0000 mg | ORAL_TABLET | Freq: Every day | ORAL | 3 refills | Status: AC

## 2024-05-19 ENCOUNTER — Other Ambulatory Visit: Payer: Self-pay

## 2024-05-19 DIAGNOSIS — I10 Essential (primary) hypertension: Secondary | ICD-10-CM

## 2024-05-19 MED ORDER — METOPROLOL SUCCINATE ER 50 MG PO TB24: 50.0000 mg | ORAL_TABLET | Freq: Every day | ORAL | 1 refills | Status: AC

## 2024-09-15 ENCOUNTER — Encounter: Admitting: Family Medicine
